# Patient Record
Sex: Female | Born: 1950
Health system: Southern US, Community
[De-identification: ages and names within clinical notes are randomized; demographics above are authoritative.]

## PROBLEM LIST (undated history)

## (undated) DIAGNOSIS — E119 Type 2 diabetes mellitus without complications: Secondary | ICD-10-CM

## (undated) DIAGNOSIS — I10 Essential (primary) hypertension: Secondary | ICD-10-CM

## (undated) HISTORY — PX: TONSILLECTOMY: SUR1361

## (undated) HISTORY — PX: ABDOMINAL HYSTERECTOMY: SHX81

## (undated) HISTORY — DX: Essential (primary) hypertension: I10

## (undated) HISTORY — DX: Type 2 diabetes mellitus without complications: E11.9

---

## 2000-01-11 ENCOUNTER — Encounter: Admission: RE | Admit: 2000-01-11 | Discharge: 2000-02-01 | Payer: Self-pay | Admitting: Family Medicine

## 2001-06-12 ENCOUNTER — Ambulatory Visit (HOSPITAL_COMMUNITY): Admission: RE | Admit: 2001-06-12 | Discharge: 2001-06-12 | Payer: Self-pay | Admitting: *Deleted

## 2003-03-05 ENCOUNTER — Encounter: Payer: Self-pay | Admitting: Family Medicine

## 2003-03-05 ENCOUNTER — Ambulatory Visit (HOSPITAL_COMMUNITY): Admission: RE | Admit: 2003-03-05 | Discharge: 2003-03-05 | Payer: Self-pay | Admitting: Family Medicine

## 2003-04-01 ENCOUNTER — Encounter: Admission: RE | Admit: 2003-04-01 | Discharge: 2003-04-01 | Payer: Self-pay | Admitting: Obstetrics & Gynecology

## 2003-04-01 ENCOUNTER — Encounter: Payer: Self-pay | Admitting: Obstetrics & Gynecology

## 2003-04-06 ENCOUNTER — Encounter: Payer: Self-pay | Admitting: Obstetrics & Gynecology

## 2003-04-06 ENCOUNTER — Ambulatory Visit (HOSPITAL_COMMUNITY): Admission: RE | Admit: 2003-04-06 | Discharge: 2003-04-06 | Payer: Self-pay | Admitting: Obstetrics & Gynecology

## 2003-04-21 ENCOUNTER — Inpatient Hospital Stay (HOSPITAL_COMMUNITY): Admission: AD | Admit: 2003-04-21 | Discharge: 2003-04-24 | Payer: Self-pay | Admitting: Obstetrics & Gynecology

## 2003-04-21 ENCOUNTER — Encounter (INDEPENDENT_AMBULATORY_CARE_PROVIDER_SITE_OTHER): Payer: Self-pay

## 2003-04-21 ENCOUNTER — Encounter (INDEPENDENT_AMBULATORY_CARE_PROVIDER_SITE_OTHER): Payer: Self-pay | Admitting: *Deleted

## 2003-04-28 ENCOUNTER — Inpatient Hospital Stay (HOSPITAL_COMMUNITY): Admission: AD | Admit: 2003-04-28 | Discharge: 2003-05-04 | Payer: Self-pay | Admitting: Obstetrics & Gynecology

## 2003-04-28 ENCOUNTER — Encounter: Payer: Self-pay | Admitting: Obstetrics & Gynecology

## 2003-04-29 ENCOUNTER — Encounter: Payer: Self-pay | Admitting: Obstetrics & Gynecology

## 2003-04-30 ENCOUNTER — Encounter: Payer: Self-pay | Admitting: Obstetrics & Gynecology

## 2003-05-20 ENCOUNTER — Encounter: Admission: RE | Admit: 2003-05-20 | Discharge: 2003-05-20 | Payer: Self-pay | Admitting: Obstetrics & Gynecology

## 2003-05-20 ENCOUNTER — Encounter: Payer: Self-pay | Admitting: Obstetrics & Gynecology

## 2004-04-17 ENCOUNTER — Encounter: Admission: RE | Admit: 2004-04-17 | Discharge: 2004-04-17 | Payer: Self-pay | Admitting: Obstetrics & Gynecology

## 2005-04-27 ENCOUNTER — Encounter: Admission: RE | Admit: 2005-04-27 | Discharge: 2005-04-27 | Payer: Self-pay | Admitting: Obstetrics & Gynecology

## 2005-05-02 ENCOUNTER — Ambulatory Visit (HOSPITAL_COMMUNITY): Admission: RE | Admit: 2005-05-02 | Discharge: 2005-05-02 | Payer: Self-pay | Admitting: Obstetrics & Gynecology

## 2005-06-19 ENCOUNTER — Ambulatory Visit (HOSPITAL_COMMUNITY): Admission: RE | Admit: 2005-06-19 | Discharge: 2005-06-19 | Payer: Self-pay | Admitting: Family Medicine

## 2006-05-01 ENCOUNTER — Encounter: Admission: RE | Admit: 2006-05-01 | Discharge: 2006-05-01 | Payer: Self-pay | Admitting: Obstetrics & Gynecology

## 2007-03-14 ENCOUNTER — Emergency Department (HOSPITAL_COMMUNITY): Admission: EM | Admit: 2007-03-14 | Discharge: 2007-03-14 | Payer: Self-pay | Admitting: Emergency Medicine

## 2007-05-06 ENCOUNTER — Encounter: Admission: RE | Admit: 2007-05-06 | Discharge: 2007-05-06 | Payer: Self-pay | Admitting: Family Medicine

## 2007-05-09 ENCOUNTER — Encounter: Admission: RE | Admit: 2007-05-09 | Discharge: 2007-05-09 | Payer: Self-pay | Admitting: Family Medicine

## 2008-05-19 ENCOUNTER — Encounter: Admission: RE | Admit: 2008-05-19 | Discharge: 2008-05-19 | Payer: Self-pay | Admitting: Family Medicine

## 2008-05-28 ENCOUNTER — Encounter: Admission: RE | Admit: 2008-05-28 | Discharge: 2008-05-28 | Payer: Self-pay | Admitting: Family Medicine

## 2008-07-18 IMAGING — [facility] MM DIAGNOSTIC LTD RIGHT
3 series · 3 of 3 positions shown · non-contrast
Comparison: none

[REDACTED] RIGHT
CC and MLO view(s) were taken of the right breast.
Technologist: Aon[place], Gouren(KOUDAI)

RIGHT BREAST ULTRASOUND
Technologist: Mark Alma Motoh, Medical
DIGITAL LIMITED RIGHT DIAGNOSTIC MAMMOGRAM AND RIGHT BREAST ULTRASOUND:
CLINICAL DATA: 56-year-old female with abnormal screening mammogram with possible right breast 
mass.

[R CC (1 of 2)]
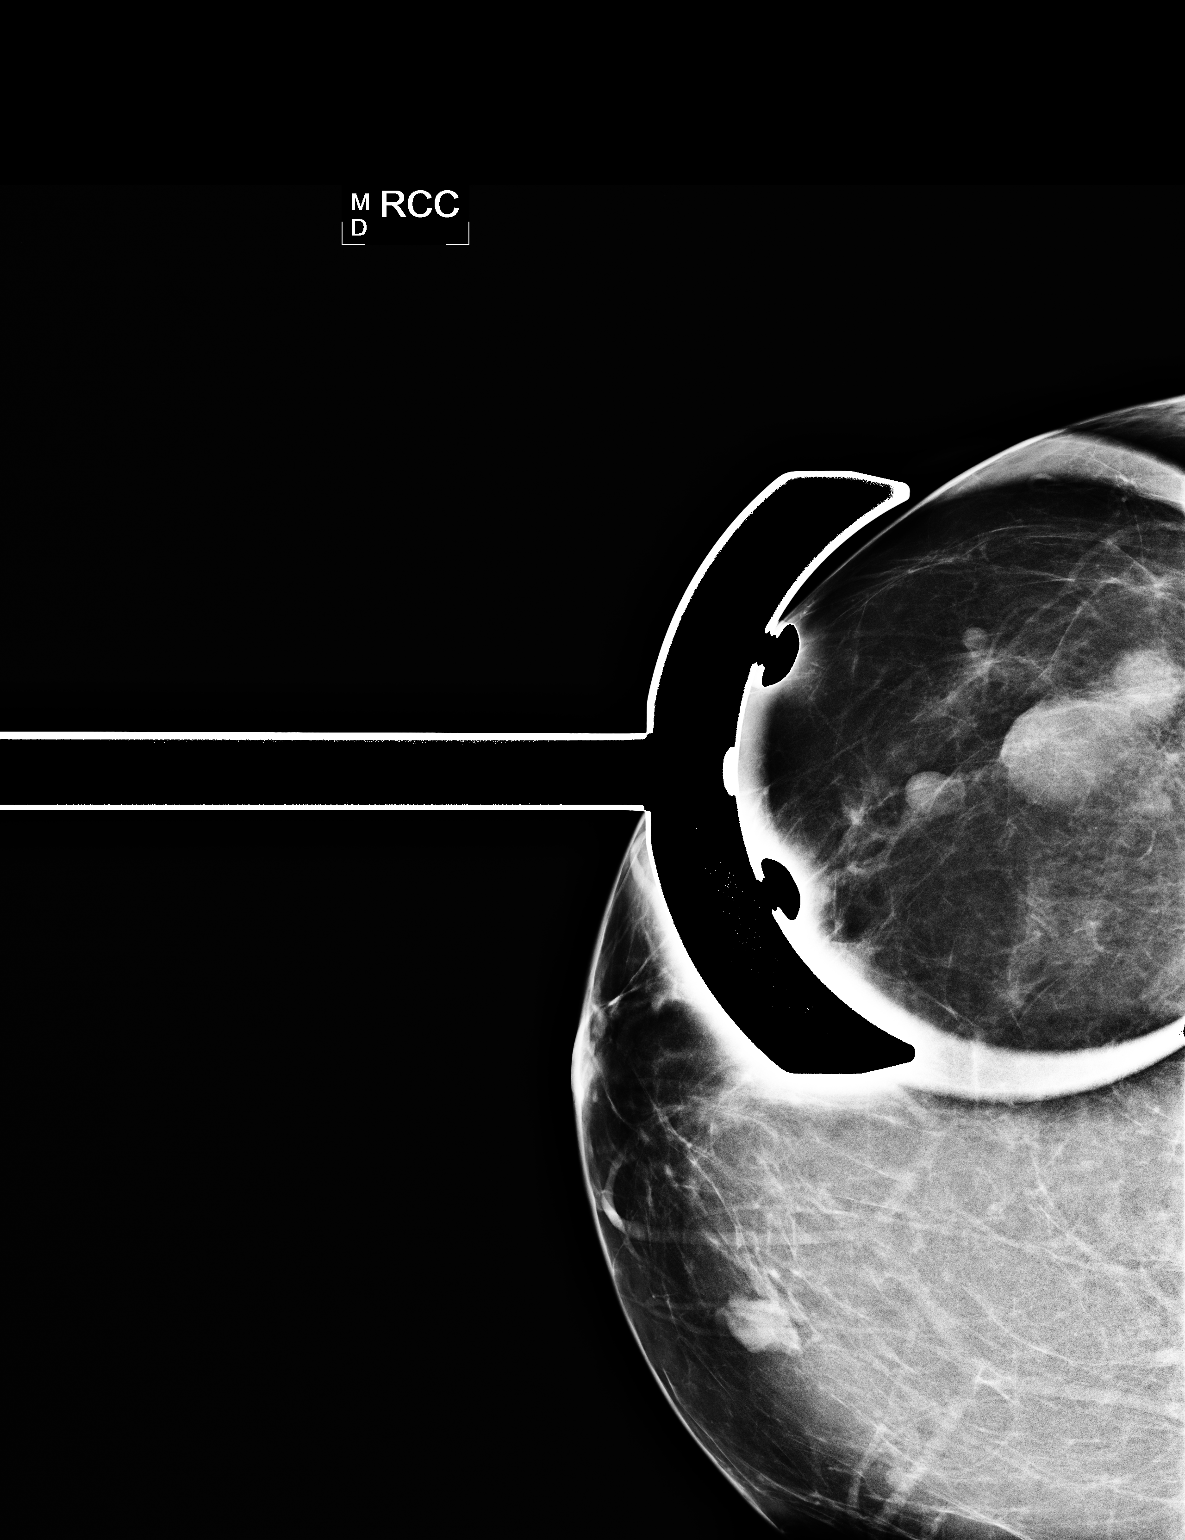

[R MLO]
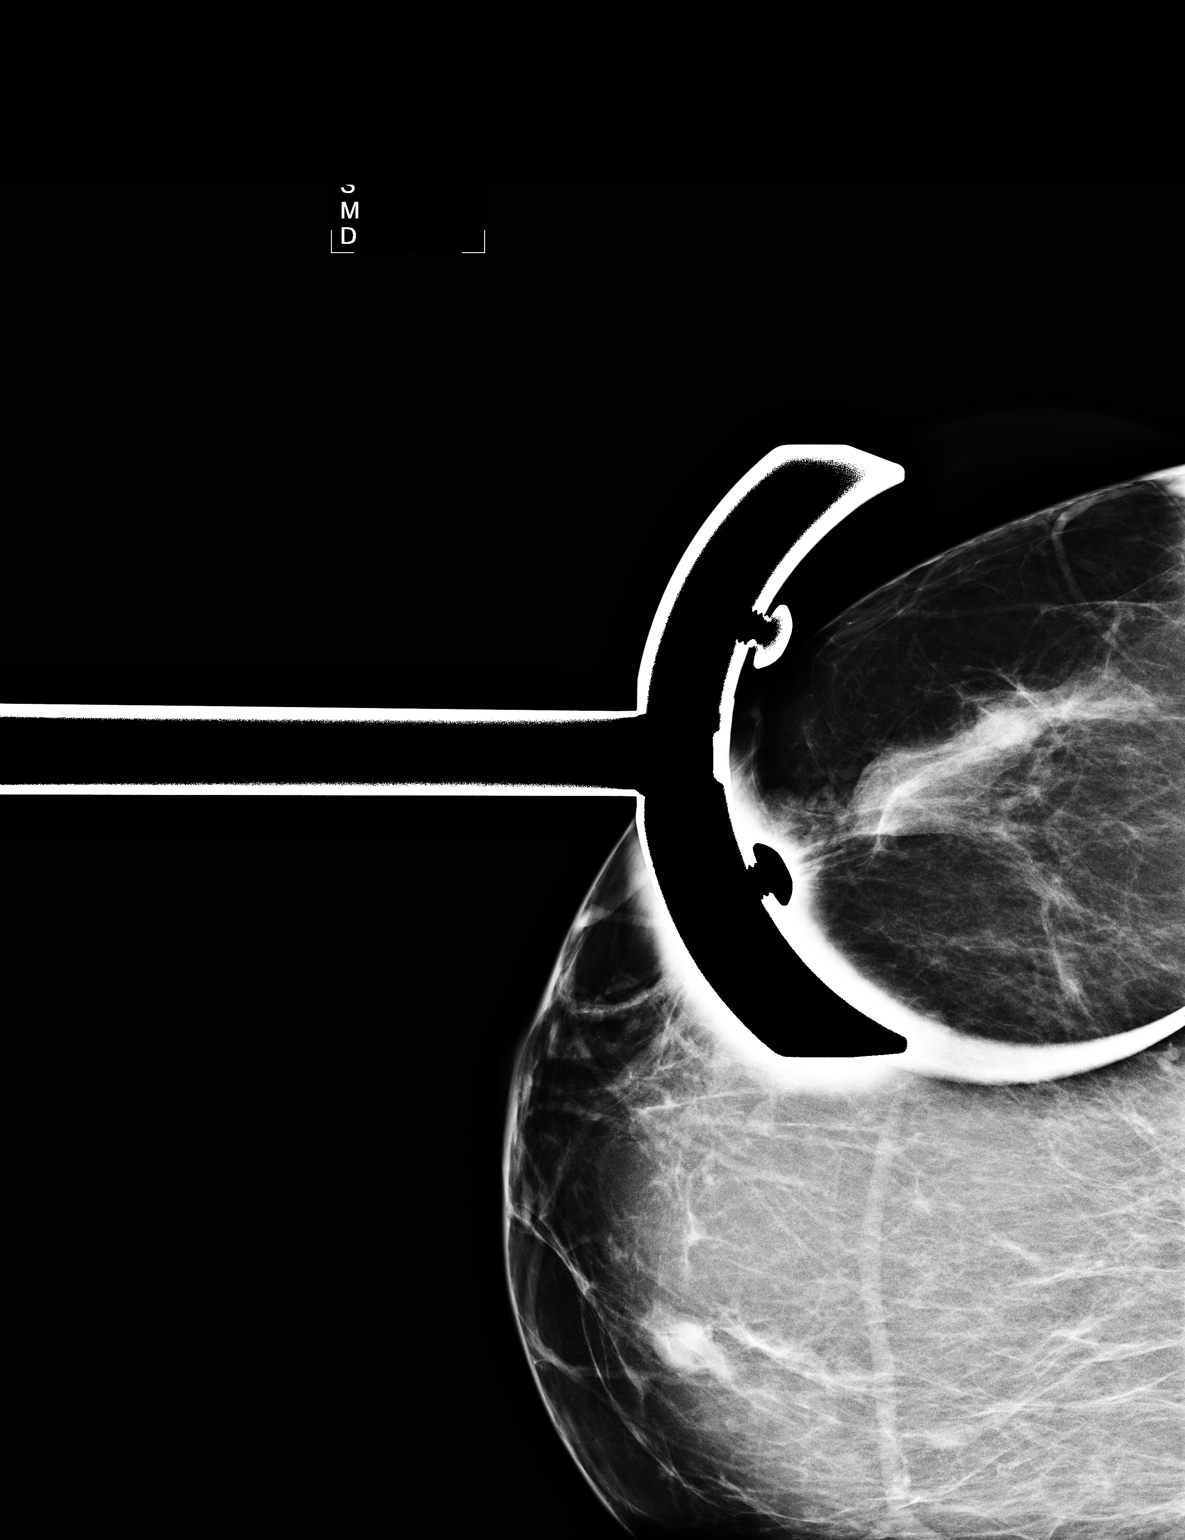

[R CC (2 of 2)]
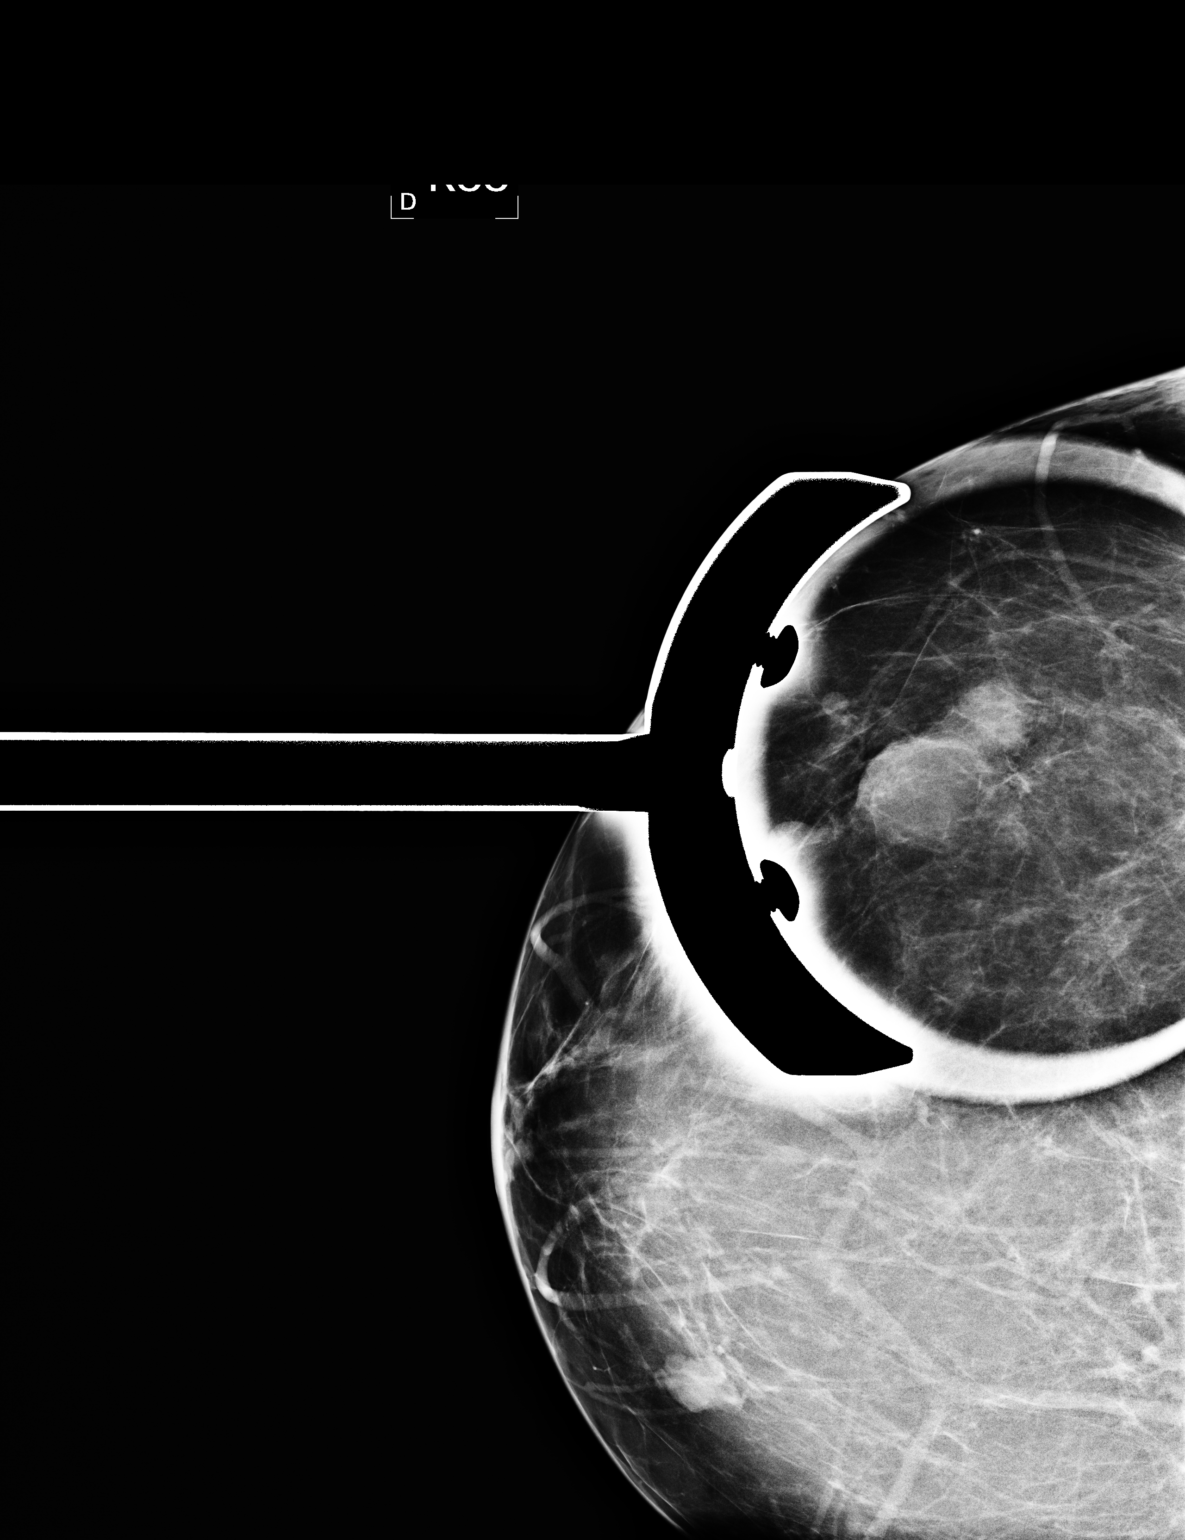

[3 of 3 positions shown; findings below may reference images not displayed]

Spot compression views of the upper outer right breast demonstrate well-circumscribed oval 
densities; at least four lie adjacent to each other in the upper outer right breast.  There is no 
evidence of malignant calcifications.

Physical examination of the upper outer right breast demonstrates nodularity in the right breast in
the 10 o'clock position 5 cm from the nipple.

Targeted ultrasound evaluation of the upper outer right breast demonstrates multiple simple cysts 
in the 10 o'clock position 5 cm from the nipple and all adjacent to each other.
IMPRESSION: Simple cysts within the upper outer right breast corresponding to mammographic abnormality.  No 
evidence of malignancy.

Recommend bilateral screening mammogram in one year.

ASSESSMENT: Benign - BI-RADS 2

Screening mammogram of both breasts in 1 year.
,

## 2009-05-20 ENCOUNTER — Encounter: Admission: RE | Admit: 2009-05-20 | Discharge: 2009-05-20 | Payer: Self-pay | Admitting: Family Medicine

## 2009-06-28 ENCOUNTER — Encounter: Admission: RE | Admit: 2009-06-28 | Discharge: 2009-06-28 | Payer: Self-pay | Admitting: Orthopedic Surgery

## 2009-07-19 ENCOUNTER — Encounter: Admission: RE | Admit: 2009-07-19 | Discharge: 2009-07-19 | Payer: Self-pay | Admitting: Orthopedic Surgery

## 2009-08-01 ENCOUNTER — Encounter (HOSPITAL_COMMUNITY): Admission: RE | Admit: 2009-08-01 | Discharge: 2009-08-24 | Payer: Self-pay | Admitting: Orthopedic Surgery

## 2009-08-07 IMAGING — MG MM DIAGNOSTIC LTD RIGHT
2 series · 2 of 2 positions shown · non-contrast
Comparison: [DATE] [DATE], [DATE], [DATE] [DATE], [DATE]

CLINICAL DATA: Possible mass right breast

DIGITAL DIAGNOSTIC  RIGHT  MAMMOGRAM  May 28, 2008 AND RIGHT
BREAST ULTRASOUND:

[R CC]
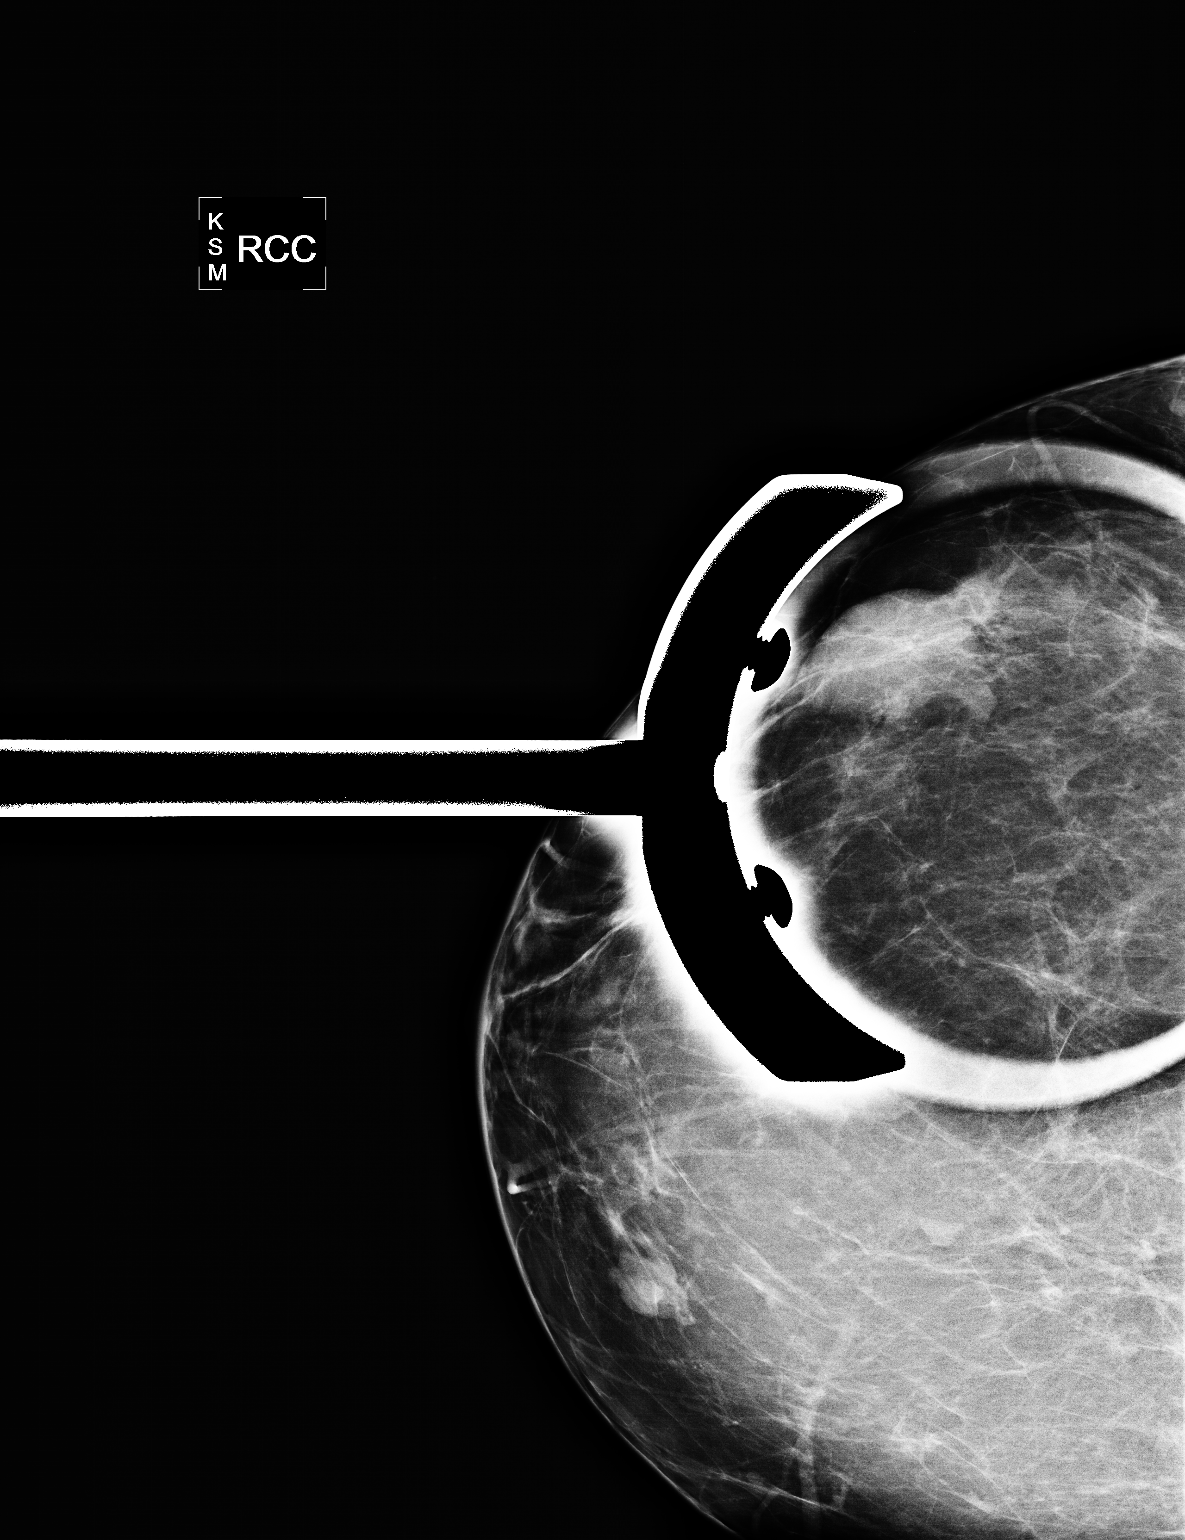

[R MLO]
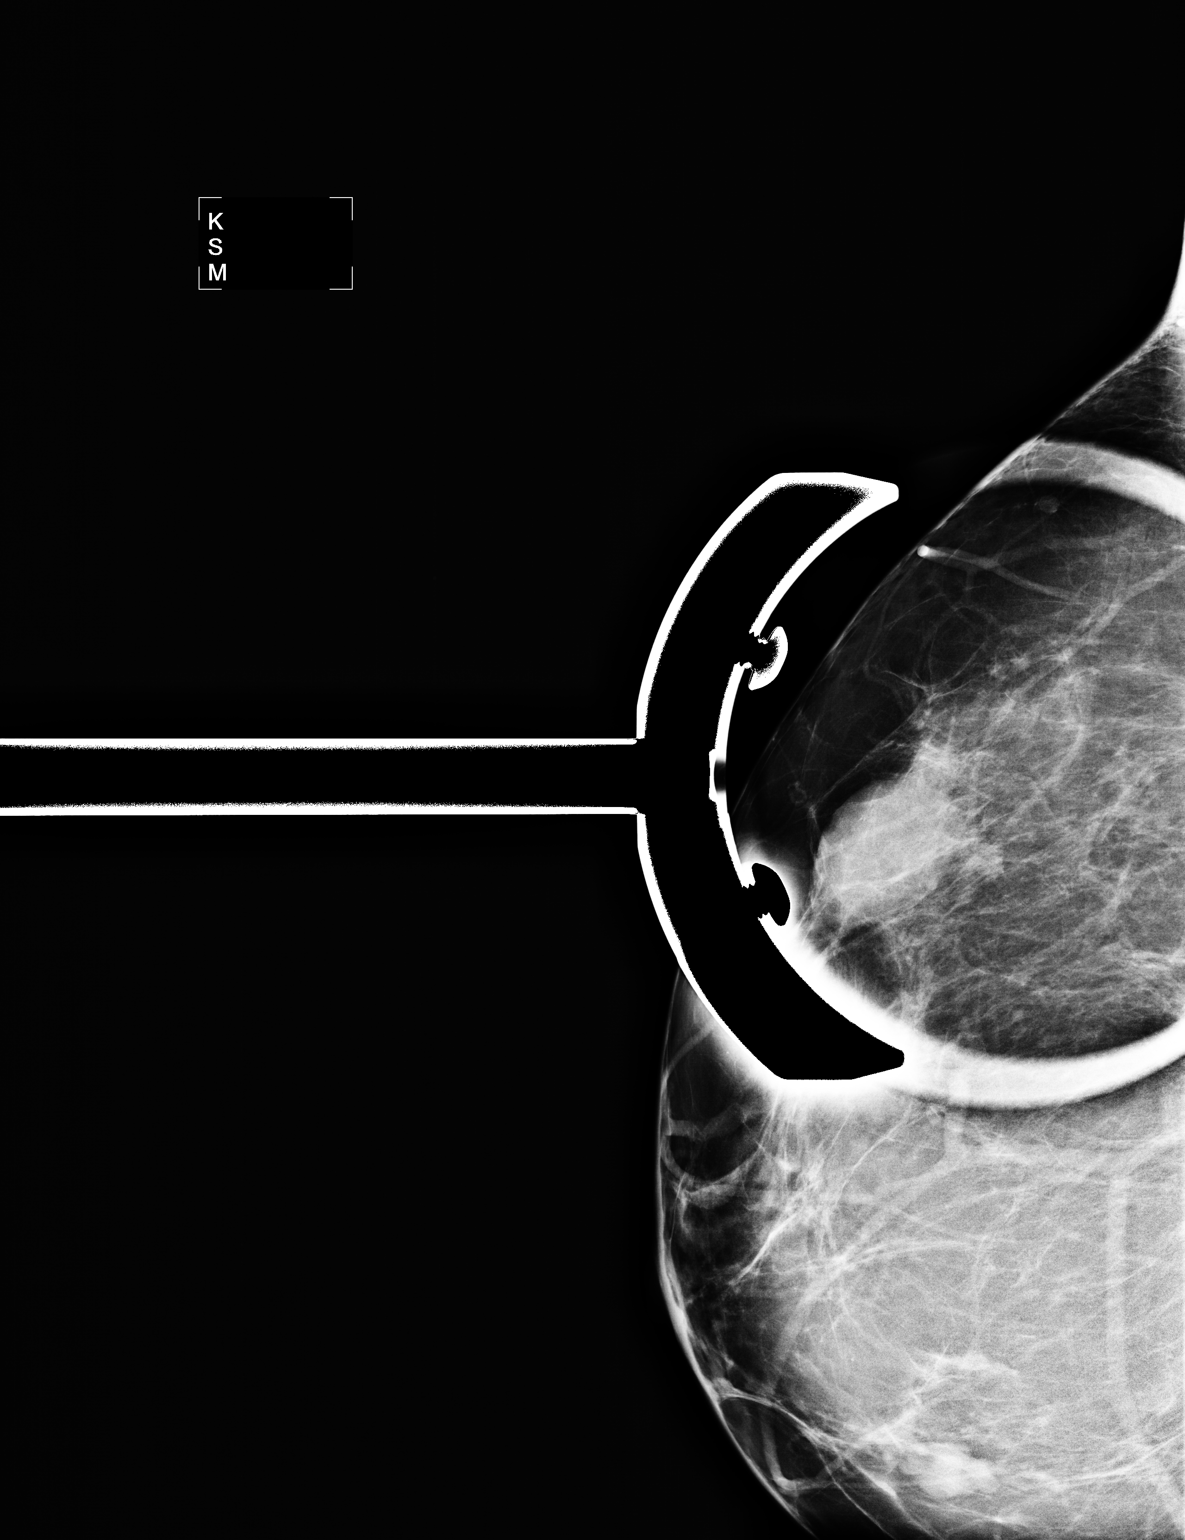

[2 of 2 positions shown; findings below may reference images not displayed]

FINDINGS: Spot compression CC and MLO views of the right breast are
submitted reviewed.  The previously noted  2.4 cm mass persists in
the upper outer quadrant right breast.  Two smaller adjacent
nodularity are present.

Ultrasound is performed, showing a 1.63 x 1.46 x 1.86 cm cyst with
mobile debri correlating to the mammographic finding.  There are
smaller adjacent cysts correlating to the mammographic finding.
IMPRESSION: Benign, recommend routine screening mammogram back on schedule

BI-RADS CATEGORY 2:  Benign finding(s).

## 2010-05-22 ENCOUNTER — Encounter: Admission: RE | Admit: 2010-05-22 | Discharge: 2010-05-22 | Payer: Self-pay | Admitting: Family Medicine

## 2010-09-17 ENCOUNTER — Encounter: Payer: Self-pay | Admitting: Orthopedic Surgery

## 2010-09-17 ENCOUNTER — Encounter: Payer: Self-pay | Admitting: Obstetrics & Gynecology

## 2011-01-12 NOTE — H&P (Signed)
   NAME:  Shari Ortiz, Shari Ortiz                           ACCOUNT NO.:  [id number]   MEDICAL RECORD NO.:  [id number]                   PATIENT TYPE:  INP   LOCATION:  9326                                 [facility]ACILITY:  [facility]   PHYSICIAN:  Roseanna Rainbow, M.D.         DATE OF BIRTH:  Apr 14, 1951   DATE OF ADMISSION:  04/28/2003  DATE OF DISCHARGE:                                HISTORY & PHYSICAL   CHIEF COMPLAINT:  The patient is a 60 year old African-American female, 1  week status post total abdominal hysterectomy and bilateral salpingo-  oophorectomy, now with nausea and vomiting times 1 day.   HISTORY OF PRESENT ILLNESS:  As above. The patient reports normal bowel  movement yesterday. She denies any fevers, abdominal pain.   PAST OBSTETRIC/GYNECOLOGIC HISTORY:  As above.   PAST MEDICAL HISTORY:  Broken left wrist.   PAST SURGICAL HISTORY:  As above.   SOCIAL HISTORY:  Divorced.  She denies any tobacco use, alcohol, or street  drug use.   FAMILY HISTORY:  Noncontributory.   REVIEW OF SYSTEMS:  GI: See history of present illness.   ALLERGIES:  No known drug allergies.   MEDICATIONS:  __________ and Jesusita Oka.   PHYSICAL EXAMINATION:  GENERAL: No acute distress.  VITAL SIGNS: Weight 176 pounds. Temperature 98.3, pulse 76, blood pressure  138/84.  ABDOMEN: Softly distended. Normal active bowel sounds throughout. Nontender.  LUNGS: Clear to auscultation bilaterally.  HEART: Regular rate and rhythm.  EXTREMITIES: Nontender.   LABORATORY DATA:  Urinalysis, specific gravity 1015, pH 6, trace leukocytes,  trace protein, negative ketones.   ASSESSMENT:  One week status post total abdominal hysterectomy, bilateral  salpingo-oophorectomy, rule out ileus versus early mechanical small bowel  obstruction.   PLAN:  Admission. IV hydration, anti-emetics. Abdominal x-ray and laboratory  work.                                              Roseanna Rainbow, M.D.   [name]  D:   04/28/2003  T:  04/28/2003  Job:  [id number]

## 2011-01-12 NOTE — Discharge Summary (Signed)
   NAME:  Shari Ortiz, Shari Ortiz                           ACCOUNT NO.:  [id number]   MEDICAL RECORD NO.:  [id number]                   PATIENT TYPE:  INP   LOCATION:  9106                                 [facility]   PHYSICIAN:  Roseanna Rainbow, M.D.         DATE OF BIRTH:  [birth year]   DATE OF ADMISSION:  04/21/2003  DATE OF DISCHARGE:  04/24/2003                                 DISCHARGE SUMMARY   CHIEF COMPLAINT:  The patient is a 60 year old African-American female with  a left-sided adnexal mass and uterine fibroids who presents for low  abdominal hysterectomy and left salping oophorectomy.  Please see the  dictated history and physical for further details.   HOSPITAL COURSE:  The patient was admitted and underwent a total abdominal  hysterectomy with bilateral salpingo-oophorectomy and lysis of adhesions.  Again see the dictated operative summary for further details.  Her  postoperative course was remarkable for hypokalemia which was repleted.  She  was discharged to home on postoperative day #3 tolerating a regular diet.   DISCHARGE DIAGNOSES:  1. Uterine fibroids.  2. Left-sided endometriotic cyst.   PROCEDURE:  Total abdominal hysterectomy and bilateral salpingo-oophorectomy  and lysis of adhesions.   DISCHARGE CONDITION:  Good.   DISCHARGE DIET:  Regular.   DISCHARGE ACTIVITIES:  No strenuous activity.   DISCHARGE MEDICATIONS:  1. Percocet 1-2 tablets q.i.d.  2. Resume home medications.   DISPOSITION:  The patient was to follow up in the office in one week.                                                Roseanna Rainbow, M.D.    [name]  D:  05/19/2003  T:  05/19/2003  Job:  [id number]

## 2011-01-12 NOTE — Discharge Summary (Signed)
   NAME:  Shari Ortiz, KOCAK                           ACCOUNT NO.:  [id number]   MEDICAL RECORD NO.:  [id number]                   PATIENT TYPE:  INP   LOCATION:  9148                                 [facility]   PHYSICIAN:  Roseanna Rainbow, M.D.         DATE OF BIRTH:  1951-07-28   DATE OF ADMISSION:  04/28/2003  DATE OF DISCHARGE:  05/04/2003                                 DISCHARGE SUMMARY   CHIEF COMPLAINT:  The patient is a 60 year old African-American female one  week status post total abdominal hysterectomy and bilateral salpingo-  oophorectomy now with nausea and vomiting.   HISTORY OF PRESENT ILLNESS:  Please see the dictated history and physical  for further details.  The patient was admitted and abdominal x-ray  demonstrated an early partial small bowel obstruction versus an ileus.  NG  tube suction drainage was initiated along with bowel rest and IV hydration.  On hospital day #2 the patient had a bowel movement.  However, she had a  fever of 101.4 without leukocytosis.  A CT of the abdomen and pelvis was  then obtained as well as a general surgery consult.  At this point she was  started on clindamycin parenterally as well as gentamicin.  CT scan  demonstrated no obstruction, probable ileus.  At this point she was  afebrile.  The NG tube was clamped.  Her diet was slowly advanced and the NG  tube was removed.  She was discharged to home on hospital day #7 tolerating  a regular diet.   DISCHARGE DIAGNOSES:  Postoperative ileus.   CONDITION ON DISCHARGE:  Stable.   DIET:  Regular.   ACTIVITY:  No strenuous activity.   DISCHARGE MEDICATIONS:  Protonix one tablet p.o. daily.   DISPOSITION:  The patient was to follow up in the office in one week.                                               Roseanna Rainbow, M.D.    [name]  D:  07/01/2003  T:  07/02/2003  Job:  [id number]

## 2011-01-12 NOTE — H&P (Signed)
NAME:  Shari Ortiz, Shari Ortiz                           ACCOUNT NO.:  [id number]   MEDICAL RECORD NO.:  [id number]                   PATIENT TYPE:  INP   LOCATION:  NA                                   [facility]   PHYSICIAN:  Roseanna Rainbow, M.D.         DATE OF BIRTH:  1950/10/23   DATE OF ADMISSION:  DATE OF DISCHARGE:                                HISTORY & PHYSICAL   CHIEF COMPLAINT:  The patient is a 60 year old African-American female with  a left-sided adnexal mass and uterine fibroids who presents for a total  abdominal hysterectomy and left salpingo-oophorectomy.   HISTORY OF PRESENT ILLNESS:  The patient has a several-month history of left  lower quadrant pain.  The quality of the pain is poorly described.  Workup  includes a CA125 on March 18, 2003 that was 15.3; a CT scan on July 23 that  was a negative CT scan of the abdomen and a complex cystic and solid mass in  the left lower pelvis with no evidence of lymphadenopathy; serial  ultrasounds consistent with a 6-8 cm left-sided hemorrhagic lesion on the  ovary.   PAST OBSTETRICAL AND GYNECOLOGICAL HISTORY:  Last menstrual period  approximately two years ago.  Onset of menopausal symptoms began at age 81.  Symptoms included hot flashes.  Hormone replacement therapy has never been  used.  She is status post a right salpingo-oophorectomy and postpartum  bilateral tubal ligation.  She has been pregnancy one time.   PAST MEDICAL HISTORY:  Broken left wrist.   SOCIAL HISTORY:  She is divorced.   FAMILY HISTORY:  Noncontributory.   REVIEW OF SYSTEMS:  GI AND GU:  See the history of present illness.   ALLERGIES:  No known drug allergies.   MEDICATIONS:  None.   PHYSICAL EXAMINATION:  VITAL SIGNS:  Weight 177 pounds, temperature 98.1,  pulse 64, blood pressure 126/70.  GENERAL:  African-American female, appears stated age.  Normal body habitus.  NECK:  Supple.  LUNGS:  Clear to auscultation bilaterally.  HEART:   Regular rate and rhythm.  ABDOMEN:  Tenderness noted in the left lower quadrant.  PELVIC:  The external female genitalia are normal appearing.  On bimanual  exam the uterus is upper limits of normal, anterior.  The adnexa were not  felt.   ASSESSMENT:  Left-sided adnexal mass, likely benign.  Also, small myomatous  uterus.    PLAN:  The planned procedure is total abdominal hysterectomy and left  salpingo-oophorectomy.  The risks, benefits, and alternative forms of  management were reviewed with the patient and informed consent was obtained.                                               Roseanna Rainbow, M.D.    Shari Ortiz  D:  04/13/2003  T:  04/13/2003  Job:  [id number]

## 2011-01-12 NOTE — Op Note (Signed)
NAME:  Shari Ortiz, Shari Ortiz                           ACCOUNT NO.:  [id number]   MEDICAL RECORD NO.:  [id number]                   PATIENT TYPE:  INP   LOCATION:  9106                                 [facility]   PHYSICIAN:  Roseanna Rainbow, M.D.         DATE OF BIRTH:  March 24, 1951   DATE OF PROCEDURE:  04/21/2003  DATE OF DISCHARGE:                                 OPERATIVE REPORT   PREOPERATIVE DIAGNOSES:  1. Left-sided hemorrhagic ovarian cyst.  2. Uterine fibroids.  3. Pelvic pain.   POSTOPERATIVE DIAGNOSES:  1. Left-sided hemorrhagic ovarian cyst.  2. Uterine fibroids.  3. Pelvic pain.   PROCEDURE:  Total abdominal hysterectomy, bilateral salpingo-oophorectomy,  and lysis of adhesions.   SURGEONS:  Roseanna Rainbow, M.D., and Bing Neighbors. Clearance Coots, M.D.   ANESTHESIA:  General endotracheal.   COMPLICATIONS:  None.   ESTIMATED BLOOD LOSS:  200 mL.   FLUIDS AND URINE OUTPUT:  As per anesthesiology.   OPERATIVE FINDINGS:  A slightly enlarged uterus.  The patient had reported  having had a right salpingo-oophorectomy; however, the right adnexa was  present and there was ovarian tissue noted.  On the left side, the left  ovary appeared grossly normal; however, sitting in the posterior cul-de-sac  on the left side was a cystic mass with filmy adhesions to the broad  ligament.  This was several centimeters in diameter with a smooth wall  exteriorly.  Frozen section revealed benign tissue.   DESCRIPTION OF PROCEDURE:  The risks, benefits, indications, and  alternatives of the procedure were reviewed with the patient and informed  consent was obtained.  The patient was taken to the operating room with an  IV running.  The patient was then placed in the dorsal lithotomy position  and given general anesthesia, prepped and draped in the usual sterile  fashion.  A midline incision was then made through the previous scar and  extended to the rectus fascia.  The fascia was  incised along the length of  the incision.  The muscles of the anterior abdominal wall were separated in  the midline.  The peritoneum was grasped between two pickups, elevated, and  entered sharply with the scalpel.  The pelvis was examined with the findings  noted above.  An O'Connor-O'Sullivan retractor was placed into the incision  and the bowel packed away with moistened laparotomy sponges.  Two long Kelly  clamps were placed on the cornua and used for retraction.  The round  ligaments on both sides were transected with the Bovie.  The anterior leaf  of the broad ligament was incised along the bladder reflection to the  midline from both sides.  There was some redundancy from previous surgery in  the bladder reflection, and these adhesions were lysed sharply with the  Bovie.  The bladder was dissected off the lower uterine segment and cervix.  The infundibulopelvic ligaments on both sides were  doubly clamped,  transected, and suture ligated with 0 Vicryl.  Free ligatures were placed as  well.  Hemostasis was visualized.  The uterine arteries were skeletonized  bilaterally, clamped with parametrial clamps, transected, and suture ligated  with 0 Vicryl.  Again hemostasis was assured.  The uterosacral ligaments on  both sides were transected and suture ligated in a similar fashion.  The  cervix and uterus were amputated.  The vaginal cuff angles were closed with  figure-of-eight sutures of 0 Vicryl.  The remainder of the vaginal cuff was  closed with a series of interrupted 0 Vicryl figure-of-eight sutures.  Hemostasis was assured.  Please note that prior to the completion of the  hysterectomy, the previous-noted cul-de-sac lesion was removed using finger  fracture blunt dissection.  The pelvis was irrigated copiously with warm  normal saline.  All laparotomy sponges and instruments were removed from the  abdomen.  The fascia and parietal peritoneum and muscle were closed in a  bulk  fashion using 0 PDS.  The Scarpa's fascia was reapproximated with 2-0  Monocryl.  The skin was closed with staples.  The sponge, lap, needle, and  instrument counts were correct x2.  The patient was taken to the PACU awake  and in stable condition.                                               Roseanna Rainbow, M.D.    [name]  D:  04/21/2003  T:  04/21/2003  Job:  [id number]

## 2011-05-28 ENCOUNTER — Other Ambulatory Visit: Payer: Self-pay | Admitting: Family Medicine

## 2011-05-28 DIAGNOSIS — Z1231 Encounter for screening mammogram for malignant neoplasm of breast: Secondary | ICD-10-CM

## 2011-06-01 ENCOUNTER — Ambulatory Visit
Admission: RE | Admit: 2011-06-01 | Discharge: 2011-06-01 | Disposition: A | Discharge status: Home / Self Care | Payer: Commercial Indemnity | Source: Ambulatory Visit | Attending: Family Medicine | Admitting: Family Medicine

## 2011-06-01 DIAGNOSIS — Z1231 Encounter for screening mammogram for malignant neoplasm of breast: Secondary | ICD-10-CM

## 2012-05-07 ENCOUNTER — Other Ambulatory Visit: Payer: Self-pay | Admitting: Family Medicine

## 2012-05-07 DIAGNOSIS — Z1231 Encounter for screening mammogram for malignant neoplasm of breast: Secondary | ICD-10-CM

## 2012-06-05 ENCOUNTER — Ambulatory Visit
Admission: RE | Admit: 2012-06-05 | Discharge: 2012-06-05 | Disposition: A | Discharge status: Home / Self Care | Payer: Commercial Indemnity | Source: Ambulatory Visit | Attending: Family Medicine | Admitting: Family Medicine

## 2012-06-05 DIAGNOSIS — Z1231 Encounter for screening mammogram for malignant neoplasm of breast: Secondary | ICD-10-CM

## 2013-04-24 ENCOUNTER — Other Ambulatory Visit: Payer: Self-pay | Admitting: Family Medicine

## 2013-04-24 DIAGNOSIS — K219 Gastro-esophageal reflux disease without esophagitis: Secondary | ICD-10-CM

## 2013-04-30 ENCOUNTER — Ambulatory Visit
Admission: RE | Admit: 2013-04-30 | Discharge: 2013-04-30 | Disposition: A | Discharge status: Home / Self Care | Payer: Managed Care, Other (non HMO) | Source: Ambulatory Visit | Attending: Family Medicine | Admitting: Family Medicine

## 2013-04-30 DIAGNOSIS — K219 Gastro-esophageal reflux disease without esophagitis: Secondary | ICD-10-CM

## 2013-05-11 ENCOUNTER — Other Ambulatory Visit: Payer: Self-pay

## 2013-05-11 DIAGNOSIS — Z1231 Encounter for screening mammogram for malignant neoplasm of breast: Secondary | ICD-10-CM

## 2013-06-10 ENCOUNTER — Ambulatory Visit
Admission: RE | Admit: 2013-06-10 | Discharge: 2013-06-10 | Disposition: A | Discharge status: Home / Self Care | Payer: Managed Care, Other (non HMO) | Source: Ambulatory Visit

## 2013-06-10 DIAGNOSIS — Z1231 Encounter for screening mammogram for malignant neoplasm of breast: Secondary | ICD-10-CM

## 2013-11-02 ENCOUNTER — Ambulatory Visit (HOSPITAL_COMMUNITY)
Admission: RE | Admit: 2013-11-02 | Discharge: 2013-11-02 | Disposition: A | Discharge status: Home / Self Care | Payer: Managed Care, Other (non HMO) | Source: Ambulatory Visit | Attending: Family Medicine | Admitting: Family Medicine

## 2013-11-02 ENCOUNTER — Other Ambulatory Visit (HOSPITAL_COMMUNITY): Payer: Self-pay | Admitting: Family Medicine

## 2013-11-02 DIAGNOSIS — M1250 Traumatic arthropathy, unspecified site: Secondary | ICD-10-CM

## 2013-11-02 DIAGNOSIS — M25559 Pain in unspecified hip: Secondary | ICD-10-CM | POA: Insufficient documentation

## 2014-01-15 ENCOUNTER — Other Ambulatory Visit: Payer: Self-pay | Admitting: Orthopedic Surgery

## 2014-01-15 DIAGNOSIS — M545 Low back pain, unspecified: Secondary | ICD-10-CM

## 2014-01-16 ENCOUNTER — Ambulatory Visit
Admission: RE | Admit: 2014-01-16 | Discharge: 2014-01-16 | Disposition: A | Discharge status: Home / Self Care | Payer: Managed Care, Other (non HMO) | Source: Ambulatory Visit | Attending: Orthopedic Surgery | Admitting: Orthopedic Surgery

## 2014-01-16 DIAGNOSIS — M545 Low back pain, unspecified: Secondary | ICD-10-CM

## 2014-05-04 ENCOUNTER — Other Ambulatory Visit: Payer: Self-pay

## 2014-05-04 DIAGNOSIS — Z1231 Encounter for screening mammogram for malignant neoplasm of breast: Secondary | ICD-10-CM

## 2014-06-11 ENCOUNTER — Encounter (INDEPENDENT_AMBULATORY_CARE_PROVIDER_SITE_OTHER): Payer: Self-pay

## 2014-06-11 ENCOUNTER — Ambulatory Visit
Admission: RE | Admit: 2014-06-11 | Discharge: 2014-06-11 | Disposition: A | Discharge status: Home / Self Care | Payer: Managed Care, Other (non HMO) | Source: Ambulatory Visit

## 2014-06-11 DIAGNOSIS — Z1231 Encounter for screening mammogram for malignant neoplasm of breast: Secondary | ICD-10-CM

## 2015-05-12 ENCOUNTER — Other Ambulatory Visit: Payer: Self-pay

## 2015-05-12 DIAGNOSIS — Z1231 Encounter for screening mammogram for malignant neoplasm of breast: Secondary | ICD-10-CM

## 2015-06-17 ENCOUNTER — Ambulatory Visit
Admission: RE | Admit: 2015-06-17 | Discharge: 2015-06-17 | Disposition: A | Discharge status: Home / Self Care | Payer: Managed Care, Other (non HMO) | Source: Ambulatory Visit

## 2015-06-17 DIAGNOSIS — Z1231 Encounter for screening mammogram for malignant neoplasm of breast: Secondary | ICD-10-CM

## 2015-07-14 ENCOUNTER — Other Ambulatory Visit (HOSPITAL_COMMUNITY)
Admission: RE | Admit: 2015-07-14 | Discharge: 2015-07-14 | Disposition: A | Discharge status: Home / Self Care | Payer: Managed Care, Other (non HMO) | Source: Ambulatory Visit | Attending: Family Medicine | Admitting: Family Medicine

## 2015-07-14 ENCOUNTER — Other Ambulatory Visit: Payer: Self-pay | Admitting: Family Medicine

## 2015-07-14 DIAGNOSIS — Z1151 Encounter for screening for human papillomavirus (HPV): Secondary | ICD-10-CM | POA: Insufficient documentation

## 2015-07-14 DIAGNOSIS — Z01419 Encounter for gynecological examination (general) (routine) without abnormal findings: Secondary | ICD-10-CM | POA: Insufficient documentation

## 2015-07-14 DIAGNOSIS — R87810 Cervical high risk human papillomavirus (HPV) DNA test positive: Secondary | ICD-10-CM | POA: Insufficient documentation

## 2015-07-18 LAB — CYTOLOGY - PAP

## 2015-11-14 ENCOUNTER — Other Ambulatory Visit (HOSPITAL_COMMUNITY)
Admission: RE | Admit: 2015-11-14 | Discharge: 2015-11-14 | Disposition: A | Discharge status: Home / Self Care | Payer: Managed Care, Other (non HMO) | Source: Ambulatory Visit | Attending: Family Medicine | Admitting: Family Medicine

## 2015-11-14 ENCOUNTER — Other Ambulatory Visit: Payer: Self-pay | Admitting: Family Medicine

## 2015-11-14 DIAGNOSIS — N760 Acute vaginitis: Secondary | ICD-10-CM | POA: Insufficient documentation

## 2015-11-14 DIAGNOSIS — Z113 Encounter for screening for infections with a predominantly sexual mode of transmission: Secondary | ICD-10-CM | POA: Insufficient documentation

## 2015-11-18 LAB — URINE CYTOLOGY ANCILLARY ONLY
Bacterial vaginitis: NEGATIVE
Candida vaginitis: NEGATIVE
Chlamydia: NEGATIVE
Neisseria Gonorrhea: NEGATIVE
Trichomonas: NEGATIVE

## 2016-03-02 ENCOUNTER — Ambulatory Visit
Admission: RE | Admit: 2016-03-02 | Discharge: 2016-03-02 | Disposition: A | Discharge status: Home / Self Care | Payer: Managed Care, Other (non HMO) | Source: Ambulatory Visit | Attending: Family Medicine | Admitting: Family Medicine

## 2016-03-02 ENCOUNTER — Other Ambulatory Visit: Payer: Self-pay | Admitting: Family Medicine

## 2016-03-02 DIAGNOSIS — R109 Unspecified abdominal pain: Secondary | ICD-10-CM

## 2016-03-02 MED ORDER — IOPAMIDOL (ISOVUE-300) INJECTION 61%
100.0000 mL | Freq: Once | INTRAVENOUS | Status: AC | PRN
  Administered 2016-03-02: 100 mL via INTRAVENOUS

## 2016-03-03 ENCOUNTER — Other Ambulatory Visit: Payer: Self-pay | Admitting: Family Medicine

## 2016-05-15 ENCOUNTER — Other Ambulatory Visit: Payer: Self-pay | Admitting: Family Medicine

## 2016-05-15 DIAGNOSIS — Z1231 Encounter for screening mammogram for malignant neoplasm of breast: Secondary | ICD-10-CM

## 2016-06-18 ENCOUNTER — Ambulatory Visit
Admission: RE | Admit: 2016-06-18 | Discharge: 2016-06-18 | Disposition: A | Discharge status: Home / Self Care | Payer: Managed Care, Other (non HMO) | Source: Ambulatory Visit | Attending: Family Medicine | Admitting: Family Medicine

## 2016-06-18 DIAGNOSIS — Z1231 Encounter for screening mammogram for malignant neoplasm of breast: Secondary | ICD-10-CM

## 2017-02-07 ENCOUNTER — Encounter (INDEPENDENT_AMBULATORY_CARE_PROVIDER_SITE_OTHER): Payer: Self-pay | Admitting: Orthopaedic Surgery

## 2017-02-07 ENCOUNTER — Ambulatory Visit (INDEPENDENT_AMBULATORY_CARE_PROVIDER_SITE_OTHER): Payer: Self-pay

## 2017-02-07 ENCOUNTER — Ambulatory Visit (INDEPENDENT_AMBULATORY_CARE_PROVIDER_SITE_OTHER): Payer: Worker's Compensation | Admitting: Orthopaedic Surgery

## 2017-02-07 VITALS — BP 119/64 | HR 81 | Ht 64.0 in | Wt 148.0 lb

## 2017-02-07 DIAGNOSIS — M25532 Pain in left wrist: Secondary | ICD-10-CM

## 2017-02-07 DIAGNOSIS — M654 Radial styloid tenosynovitis [de Quervain]: Secondary | ICD-10-CM

## 2017-02-07 NOTE — Progress Notes (Signed)
Office Visit Note   Patient: Shari Ortiz           Date of Birth: [birth year]           MRN: [id number] Visit Date: 02/07/2017              Requested by: Lucianne Lei, [address] [place], [place] [address] PCP: Lucianne Lei, MD   Assessment & Plan: Visit Diagnoses:  1. Pain in left wrist   2. De Quervain's tenosynovitis, left     Plan: Patient started on anti-inflammatories and used a splint. Injection recommended and performed with good relief. She gets some relief with the injection but not complete relief. We'll recheck her again in 2 weeks.  Follow-Up Instructions: Return in about 2 weeks (around 02/21/2017).   Orders:  Orders Placed This Encounter  Procedures  . Hand/Upper Extremity Injection/Arthrocentesis  . XR Wrist Complete Left   No orders of the defined types were placed in this encounter.     Procedures: Hand/UE Inj Date/Time: 02/10/2017 8:56 PM Performed by: Marybelle Killings Authorized by: Marybelle Killings   Site marked: the procedure site was marked   Condition: de Quervain's   Needle Size:  25 G Approach:  Radial Ultrasound Guidance: No   Medications:  0.33 mL bupivacaine 0.25 %; 0.3 mL lidocaine 1 %; 13.33 mg methylPREDNISolone acetate 40 MG/ML      Clinical Data: No additional findings.   Subjective: Chief Complaint  Patient presents with  . Left Wrist - Pain    HPI 66 year old female whose worked at [employer] for 45 years was reaching for a cone  of yarn and await been a backwards on her dominant wrist with an extension injury to wrist on 12/27/2016. She's been in a Velcro splint had a prednisone pack use intermittent ice anti-inflammatories without relief. She still been working using a wrist splint has pain at the base of her thumb pain with gripping and squeezing.  Review of Systems 14 point review of systems a obtained positive for hypertension previous hysterectomy tonsillectomy. Also positive for diabetes. Patient never smoked.  Otherwise negative as it pertains to history of present illness.   Objective: Vital Signs: BP 119/64   Pulse 81   Ht 5\' 4"  (1.626 m)   Wt 148 lb (67.1 kg)   BMI 25.40 kg/m   Physical Exam  Constitutional: She is oriented to person, place, and time. She appears well-developed.  HENT:  Head: Normocephalic.  Right Ear: External ear normal.  Left Ear: External ear normal.  Eyes: Pupils are equal, round, and reactive to light.  Neck: No tracheal deviation present. No thyromegaly present.  Cardiovascular: Normal rate.   Pulmonary/Chest: Effort normal.  Abdominal: Soft.  Musculoskeletal:  Normal cervical range of motion notes peripheral nerve entrapment upper extremities median nerve forearm and wrist as well as ulnar nerve. No shoulder subluxation elbow has full range of motion. Exquisite tenderness over the left wrist first  dorsal compartment. Pulses of the wrist are normal normal heel toe gait no hyperreflexia. Positive Finkelstein test. Negative Tinel's at the wrist negative Phalen's. Allen's test at the wrist is normal.  Neurological: She is alert and oriented to person, place, and time.  Skin: Skin is warm and dry.  Psychiatric: She has a normal mood and affect. Her behavior is normal.    Ortho Exam  Specialty Comments:  No specialty comments available.  Imaging: No results found.   PMFS History: Patient Active Problem List  Diagnosis Date Noted  . De Quervain's tenosynovitis, right 02/07/2017   Past Medical History:  Diagnosis Date  . Diabetes mellitus without complication ([place])   . Hypertension     No family history on file.  Past Surgical History:  Procedure Laterality Date  . ABDOMINAL HYSTERECTOMY    . TONSILLECTOMY     Social History   Occupational History  . Not on file.   Social History Main Topics  . Smoking status: Never Smoker  . Smokeless tobacco: Never Used  . Alcohol use No  . Drug use: No  . Sexual activity: Not on file

## 2017-02-10 MED ORDER — LIDOCAINE HCL 1 % IJ SOLN
0.3000 mL | INTRAMUSCULAR | Status: AC | PRN
  Administered 2017-02-10: .3 mL

## 2017-02-10 MED ORDER — METHYLPREDNISOLONE ACETATE 40 MG/ML IJ SUSP
13.3300 mg | INTRAMUSCULAR | Status: AC | PRN
  Administered 2017-02-10: 13.33 mg

## 2017-02-10 MED ORDER — BUPIVACAINE HCL 0.25 % IJ SOLN
0.3300 mL | INTRAMUSCULAR | Status: AC | PRN
  Administered 2017-02-10: .33 mL

## 2017-02-21 ENCOUNTER — Ambulatory Visit (INDEPENDENT_AMBULATORY_CARE_PROVIDER_SITE_OTHER): Payer: Worker's Compensation | Admitting: Orthopaedic Surgery

## 2017-02-21 ENCOUNTER — Encounter (INDEPENDENT_AMBULATORY_CARE_PROVIDER_SITE_OTHER): Payer: Self-pay | Admitting: Orthopaedic Surgery

## 2017-02-21 VITALS — BP 111/61 | HR 74 | Ht 63.0 in | Wt 147.0 lb

## 2017-02-21 DIAGNOSIS — M654 Radial styloid tenosynovitis [de Quervain]: Secondary | ICD-10-CM | POA: Diagnosis not present

## 2017-02-21 NOTE — Addendum Note (Signed)
Addended by: Meyer Cory on: 02/21/2017 02:42 PM   Modules accepted: Orders

## 2017-02-21 NOTE — Progress Notes (Signed)
Office Visit Note   Patient: Shari Ortiz           Date of Birth: 1950-12-23           MRN: [id number] Visit Date: 02/21/2017              Requested by: Lucianne Lei, MD [address] [place], [place] [address] PCP: Lucianne Lei, MD   Assessment & Plan: Visit Diagnoses:  1. De Quervain's tenosynovitis, right     Plan: Patient initially got partial relief from the first dorsal compartment injection with less pain relief than expected ref injection and really no improvement in her symptoms from the injection she reports to me. I recommend proceeding with MRI of the wrist for evaluation of the [facility] joint as well as first dorsal compartment dorsal compartment since she's not responded to injection and splinting. Office follow-up after scan.  Follow-Up Instructions: No Follow-up on file.   Orders:  No orders of the defined types were placed in this encounter.  No orders of the defined types were placed in this encounter.     Procedures: No procedures performed   Clinical Data: No additional findings.   Subjective: Chief Complaint  Patient presents with  . Right Wrist - Follow-up    HPI 66 year old female returns for follow-up of on-the-job injury when she injured her right dominant wrist with radial sided wrist pain. She's had pain with gripping and squeezing. She was injured at work on 12/27/2016 when she was she was reaching for a cone pulling it back and states her wrist went back into extension sharp pain in her wrist at that time. She's been treated with the Velcro splint which she continues to wear. She's had a prednisone pack, anti-inflammatories used ice intermittently. She has had consistent pain with gripping and squeezing in her left hand. She has continued to work using her brace. She has some vacation days coming out.  Review of Systems 14 point review of systems updated of note is positive history for diabetes, hypertension. Patient's a  nonsmoker.   Objective: Vital Signs: BP 111/61   Pulse 74   Ht 5\' 3"  (1.6 m)   Wt 147 lb (66.7 kg)   BMI 26.04 kg/m   Physical Exam  Constitutional: She is oriented to person, place, and time. She appears well-developed.  HENT:  Head: Normocephalic.  Right Ear: External ear normal.  Left Ear: External ear normal.  Eyes: Pupils are equal, round, and reactive to light.  Neck: No tracheal deviation present. No thyromegaly present.  Cardiovascular: Normal rate.   Pulmonary/Chest: Effort normal.  Abdominal: Soft.  Musculoskeletal:  Patient has a continued pain with gripping pain with flexion of the MP joint of the thumb pain with the positive thumb grind test and tenderness at the carpometacarpal junction. She still tender over the first dorsal compartment and there is some prominence of the first dorsal compartment on the right hand compared to the left hand. No thenar or hyperthenar weakness no evidence of triggering of the thumb or other fingers. Sensation her fingertips is intact. No clubbing normal. She has some discomfort with wrist flexion and extension. Third through sixth dorsal compartments are normal with palpation no wrist synovitis noted at the radiocarpal joint. Tuberosity the scaphoid is nontender. No tenderness with snuffbox palpation but tenderness over the carpometacarpal joint and first dorsal compartment.  Neurological: She is alert and oriented to person, place, and time.  Skin: Skin is warm and dry.  Psychiatric: She has a normal mood and affect. Her behavior is normal.    Ortho Exam  Specialty Comments:  No specialty comments available.  Imaging: No results found.   PMFS History: Patient Active Problem List   Diagnosis Date Noted  . De Quervain's tenosynovitis, right 02/07/2017   Past Medical History:  Diagnosis Date  . Diabetes mellitus without complication ([place])   . Hypertension     No family history on file.  Past Surgical History:  Procedure  Laterality Date  . ABDOMINAL HYSTERECTOMY    . TONSILLECTOMY     Social History   Occupational History  . Not on file.   Social History Main Topics  . Smoking status: Never Smoker  . Smokeless tobacco: Never Used  . Alcohol use No  . Drug use: No  . Sexual activity: Not on file

## 2017-03-07 ENCOUNTER — Telehealth (INDEPENDENT_AMBULATORY_CARE_PROVIDER_SITE_OTHER): Payer: Self-pay

## 2017-03-07 NOTE — Telephone Encounter (Signed)
Can you please advise? Per the last office note, patient had been continuing to work while wearing her brace. Thanks.

## 2017-03-07 NOTE — Telephone Encounter (Signed)
pts employer needs current work status from the 02/21/17 office visit.

## 2017-03-07 NOTE — Telephone Encounter (Signed)
Ucall. OK to continue , call pt and see if she feels she can work with the spint on then OK with that. If not then OOW til ROV after MRI.

## 2017-03-08 NOTE — Telephone Encounter (Signed)
Pt can continue regular work while wearing her brace. She is still awaiting MRI approval. Thanks.

## 2017-03-13 ENCOUNTER — Encounter (INDEPENDENT_AMBULATORY_CARE_PROVIDER_SITE_OTHER): Payer: Self-pay

## 2017-03-13 NOTE — Telephone Encounter (Signed)
Spoke with Pola Corn @ [employer] and advised him on work status. Emailed him the work note and office note per his request [email or web address] [phone]

## 2017-03-19 ENCOUNTER — Telehealth (INDEPENDENT_AMBULATORY_CARE_PROVIDER_SITE_OTHER): Payer: Self-pay | Admitting: Orthopaedic Surgery

## 2017-03-19 NOTE — Telephone Encounter (Signed)
Shari Ortiz with [employer] called advised they are trying to reschedule  Patient for MRI. Patient was to claustrophobic to do the MRI. Shari Ortiz said they are making there 1st attempt today.  The number to contact [employer] is [phone]

## 2017-03-19 NOTE — Telephone Encounter (Signed)
fyi

## 2017-03-20 NOTE — Telephone Encounter (Signed)
Ucall. OK for valium 5 mg # 3 . Thanks see if you can get another provider to sign if pt does not have , thanks

## 2017-03-20 NOTE — Telephone Encounter (Signed)
Fyi. Patient declined Valium. She states that there is no way that she can go into the closed unit and that once her head started in she "about had a heart attack". She states that [employer] is trying to find an open unit for the scan.

## 2017-04-04 ENCOUNTER — Telehealth (INDEPENDENT_AMBULATORY_CARE_PROVIDER_SITE_OTHER): Payer: Self-pay | Admitting: Radiology

## 2017-04-04 NOTE — Telephone Encounter (Signed)
Patient called and would like a return call with her MRI results.  I am not sure where MRI was done, will need to look into this and then call her- we are pending receipt of MRI results.

## 2017-04-05 ENCOUNTER — Telehealth (INDEPENDENT_AMBULATORY_CARE_PROVIDER_SITE_OTHER): Payer: Self-pay

## 2017-04-05 NOTE — Telephone Encounter (Signed)
MRI results to be faxed to [place] per [facility] rep, see other message.

## 2017-04-05 NOTE — Telephone Encounter (Signed)
Shari Ortiz with [facility] will be faxing patient's MRI results and would like for patient to have a F/U appt.  CB# is [phone].

## 2017-04-05 NOTE — Telephone Encounter (Signed)
Called pt and scheduled her an appt on 04/11/17. Called Claiborne Billings back @ [place] and advised.

## 2017-04-08 NOTE — Telephone Encounter (Signed)
Will you please call her and tell her that Shari Ortiz will review the MRI with her in [facility] Thursday?

## 2017-04-09 ENCOUNTER — Telehealth (INDEPENDENT_AMBULATORY_CARE_PROVIDER_SITE_OTHER): Payer: Self-pay | Admitting: Orthopaedic Surgery

## 2017-04-09 NOTE — Telephone Encounter (Signed)
Called patient left message to return call   [phone]

## 2017-04-09 NOTE — Telephone Encounter (Signed)
Called patient left message to return call  [phone]

## 2017-04-11 ENCOUNTER — Ambulatory Visit (INDEPENDENT_AMBULATORY_CARE_PROVIDER_SITE_OTHER): Payer: Worker's Compensation | Admitting: Orthopaedic Surgery

## 2017-04-11 ENCOUNTER — Encounter (INDEPENDENT_AMBULATORY_CARE_PROVIDER_SITE_OTHER): Payer: Self-pay | Admitting: Orthopaedic Surgery

## 2017-04-11 VITALS — BP 106/58 | HR 82

## 2017-04-11 DIAGNOSIS — M1811 Unilateral primary osteoarthritis of first carpometacarpal joint, right hand: Secondary | ICD-10-CM

## 2017-04-11 DIAGNOSIS — M654 Radial styloid tenosynovitis [de Quervain]: Secondary | ICD-10-CM

## 2017-04-11 DIAGNOSIS — M67431 Ganglion, right wrist: Secondary | ICD-10-CM

## 2017-04-11 NOTE — Progress Notes (Signed)
Office Visit Note   Patient: Shari Ortiz           Date of Birth: 08/17/1951           MRN: [id number] Visit Date: 04/11/2017              Requested by: Lucianne Lei, MD [address] [place], [place] [address] PCP: Lucianne Lei, MD   Assessment & Plan: Visit Diagnoses:  1. De Quervain's tenosynovitis, right   2. Ganglion cyst of volar aspect of right wrist   3. Arthritis of carpometacarpal [facility]) joint of right thumb     Plan:   first [place] joint injected with cortisone and Marcaine. She got some relief of pain. She is still tender over the first dorsal compartment. She has minimal tenderness over the volar wrist capsule.We will see she does with the injection and see her back again in 3 weeks.  Follow-Up Instructions: Return in about 3 weeks (around 05/02/2017).   Orders:  Orders Placed This Encounter  Procedures  . Small Joint Injection/Arthrocentesis   No orders of the defined types were placed in this encounter.     Procedures: Small Joint Inj Date/Time: 04/11/2017 11:09 AM Performed by: Marybelle Killings Authorized by: Marybelle Killings   Location:  Thumb Site:  R thumb CMC Needle Size:  25 G Approach:  Dorsal Ultrasound Guided: No   Fluoroscopic Guidance: No   Medications:  0.3 mL lidocaine (PF) 1 %; 0.5 mL bupivacaine 0.5 %; 20 mg methylPREDNISolone acetate 40 MG/ML Aspiration Attempted: No       Clinical Data: No additional findings.   Subjective: Chief Complaint  Patient presents with  . Right Wrist - Follow-up, Pain    HPI Patient returns with the follow-up of on-the-job injury with persistent right wrist pain. She had previous first dorsal compartment injection with the really no relief. She's had an MRI scan which is available for review. This shows marked first [place] degenerative changes. There is increased signal in the first dorsal compartment with tendinosis and Tenosynovitis. She also has some changes in the median nerve suggestive of median  neuropathy. Small volar ganglion is noted at the volar aspect of the distal radius which measures [id number] cm. Review of Systems 14 point review of systems is updated and is unchanged from 02/21/2017 except as mentioned in history of present illness   Objective: Vital Signs: BP (!) 106/58   Pulse 82   Physical Exam  Constitutional: She is oriented to person, place, and time. She appears well-developed.  HENT:  Head: Normocephalic.  Right Ear: External ear normal.  Left Ear: External ear normal.  Eyes: Pupils are equal, round, and reactive to light.  Neck: No tracheal deviation present. No thyromegaly present.  Cardiovascular: Normal rate.   Pulmonary/Chest: Effort normal.  Abdominal: Soft.  Neurological: She is alert and oriented to person, place, and time.  Skin: Skin is warm and dry.  Psychiatric: She has a normal mood and affect. Her behavior is normal.    Ortho Exam patient has tenderness over the first dorsal compartment. She's tender over the right CMC joint. Positive grind test. I'm unable to palpate the ganglion over the volar wrist joint. FCR is strong. Sensation the fingertip is intact.  Specialty Comments:  No specialty comments available.  Imaging: No results found.   PMFS History: Patient Active Problem List   Diagnosis Date Noted  . De Quervain's tenosynovitis, right 02/07/2017   Past Medical History:  Diagnosis  Date  . Diabetes mellitus without complication ([place])   . Hypertension     No family history on file.  Past Surgical History:  Procedure Laterality Date  . ABDOMINAL HYSTERECTOMY    . TONSILLECTOMY     Social History   Occupational History  . Not on file.   Social History Main Topics  . Smoking status: Never Smoker  . Smokeless tobacco: Never Used  . Alcohol use No  . Drug use: No  . Sexual activity: Not on file

## 2017-04-24 ENCOUNTER — Telehealth (INDEPENDENT_AMBULATORY_CARE_PROVIDER_SITE_OTHER): Payer: Self-pay

## 2017-04-24 NOTE — Telephone Encounter (Signed)
Faxed last 2 work notes to employer per their request

## 2017-04-29 DIAGNOSIS — M1811 Unilateral primary osteoarthritis of first carpometacarpal joint, right hand: Secondary | ICD-10-CM | POA: Insufficient documentation

## 2017-04-29 DIAGNOSIS — M67431 Ganglion, right wrist: Secondary | ICD-10-CM | POA: Insufficient documentation

## 2017-04-29 MED ORDER — LIDOCAINE HCL (PF) 1 % IJ SOLN
0.3000 mL | INTRAMUSCULAR | Status: AC | PRN
  Administered 2017-04-11: .3 mL

## 2017-04-29 MED ORDER — BUPIVACAINE HCL 0.5 % IJ SOLN
0.5000 mL | INTRAMUSCULAR | Status: AC | PRN
  Administered 2017-04-11: .5 mL via INTRA_ARTICULAR

## 2017-04-29 MED ORDER — METHYLPREDNISOLONE ACETATE 40 MG/ML IJ SUSP
20.0000 mg | INTRAMUSCULAR | Status: AC | PRN
  Administered 2017-04-11: 20 mg via INTRA_ARTICULAR

## 2017-05-02 ENCOUNTER — Ambulatory Visit (INDEPENDENT_AMBULATORY_CARE_PROVIDER_SITE_OTHER): Payer: Self-pay

## 2017-05-02 ENCOUNTER — Ambulatory Visit (INDEPENDENT_AMBULATORY_CARE_PROVIDER_SITE_OTHER): Payer: Worker's Compensation | Admitting: Orthopaedic Surgery

## 2017-05-02 ENCOUNTER — Encounter (INDEPENDENT_AMBULATORY_CARE_PROVIDER_SITE_OTHER): Payer: Self-pay | Admitting: Orthopaedic Surgery

## 2017-05-02 VITALS — BP 114/60 | HR 80 | Ht 63.0 in | Wt 147.0 lb

## 2017-05-02 DIAGNOSIS — M542 Cervicalgia: Secondary | ICD-10-CM

## 2017-05-02 DIAGNOSIS — M5030 Other cervical disc degeneration, unspecified cervical region: Secondary | ICD-10-CM

## 2017-05-02 DIAGNOSIS — M1811 Unilateral primary osteoarthritis of first carpometacarpal joint, right hand: Secondary | ICD-10-CM

## 2017-05-02 DIAGNOSIS — M67431 Ganglion, right wrist: Secondary | ICD-10-CM

## 2017-05-02 DIAGNOSIS — M654 Radial styloid tenosynovitis [de Quervain]: Secondary | ICD-10-CM

## 2017-05-02 MED ORDER — TRAMADOL HCL 50 MG PO TABS: 50.0000 mg | ORAL_TABLET | Freq: Every evening | ORAL | 0 refills | Status: DC | PRN

## 2017-05-02 NOTE — Addendum Note (Signed)
Addended by: Meyer Cory on: 05/02/2017 03:13 PM   Modules accepted: Orders

## 2017-05-02 NOTE — Addendum Note (Signed)
Addended by: Meyer Cory on: 05/02/2017 03:40 PM   Modules accepted: Orders

## 2017-05-02 NOTE — Progress Notes (Signed)
Office Visit Note   Patient: Shari Ortiz           Date of Birth: 06/26/1951           MRN: [id number] Visit Date: 05/02/2017              Requested by: Lucianne Lei, [address] [place], [place] [address] PCP: Lucianne Lei, MD   Assessment & Plan: Visit Diagnoses:  1. Neck pain   2. De Quervain's tenosynovitis, right   3. Arthritis of carpometacarpal (CMC) joint of right thumb   4. Ganglion cyst of volar aspect of right wrist   5. Other cervical disc degeneration, unspecified cervical region     Plan: I will proceed with EMGs nerve conduction velocities. There symptoms may be related to her neck versus carpal tunnel compression. We reviewed the MRI scan and she brought her disc with her today. She does have the ganglion there is some flattening of the median nerve. Targeted to determine if her pain primarily is coming from cervical spine or from the carpal canal or possibly related to the ganglion. She did have some positive findings first dorsal compartment but did not get relief with injection in that area is not as tender currently. The first [facility] joint which had positive findings on physical exam injection did not really give her any relief even after the injection. Office follow-up after electrical test.  Follow-Up Instructions: No Follow-up on file.   Orders:  Orders Placed This Encounter  Procedures  . XR Cervical Spine 2 or 3 views   No orders of the defined types were placed in this encounter.     Procedures: No procedures performed   Clinical Data: No additional findings.   Subjective: Chief Complaint  Patient presents with  . Right Wrist - Pain, Follow-up    HPI 66-year-old female returns with ongoing problems with the right wrist pain. She's been using a splint she still working. Pain bothers her at nighttime she has to get up the middle of night apply ice shake her hand takes a while and then she is able fall back asleep. MRI scan was reviewed  with her today she brought her disc. This shows a volar ganglion adjacent to the radial artery FCR which does not show any tendinopathy changes. Ganglion is not adjacent median nerve. There is some flattening of the median nerve and it looks smaller than normal which may suggest some median compression. He drops objects. She still has pain over the radial side of her hand that radiates up and follows the first dorsal compartment but the injection in this as well as injection at her [facility] joint did not give her any significant relief.  Review of Systems 14 point review of systems updated and is unchanged from last office visit other than as mentioned in history of present illness  Objective: Vital Signs: BP 114/60   Pulse 80   Ht 5\' 3"  (1.6 m)   Wt 147 lb (66.7 kg)   BMI 26.04 kg/m   Physical Exam  Constitutional: She is oriented to person, place, and time. She appears well-developed.  HENT:  Head: Normocephalic.  Right Ear: External ear normal.  Left Ear: External ear normal.  Eyes: Pupils are equal, round, and reactive to light.  Neck: No tracheal deviation present. No thyromegaly present.  Cardiovascular: Normal rate.   Pulmonary/Chest: Effort normal.  Abdominal: Soft.  Neurological: She is alert and oriented to person, place, and time.  Skin: Skin is warm and dry.  Psychiatric: She has a normal mood and affect. Her behavior is normal.    Ortho Exam says mild discomfort cervical compression no significant relief with distraction some discomfort with extension for flexion chin 2 finger breast in the chest. She has brachial plexus tenderness more significant on the right than left she has some tenderness palpation over the radial artery at the wrist and the the ganglion is not definitely palpable. Pain with carpal compression. Some numbness in her fingers with positive Phalen's test. First dose compartment is still tender still is tenderness with palpation over the first [facility]  joint.  Specialty Comments:  No specialty comments available.  Imaging: Xr Cervical Spine 2 Or 3 Views  Result Date: 05/02/2017 AP and lateral cervical spine x-rays obtained and reviewed. This shows narrowing at C6-7 with the vertebral body spurring 50% narrowing of the disc space. Some facet changes on AP x-ray and uncovertebral hypertrophy at C6-7. Impression: Cervical spondylosis with some narrowing at C6-7. No anterolisthesis.    PMFS History: Patient Active Problem List   Diagnosis Date Noted  . Ganglion cyst of volar aspect of right wrist 04/29/2017  . Arthritis of carpometacarpal [facility]) joint of right thumb 04/29/2017  . De Quervain's tenosynovitis, right 02/07/2017   Past Medical History:  Diagnosis Date  . Diabetes mellitus without complication ([place])   . Hypertension     No family history on file.  Past Surgical History:  Procedure Laterality Date  . ABDOMINAL HYSTERECTOMY    . TONSILLECTOMY     Social History   Occupational History  . Not on file.   Social History Main Topics  . Smoking status: Never Smoker  . Smokeless tobacco: Never Used  . Alcohol use No  . Drug use: No  . Sexual activity: Not on file

## 2017-05-14 ENCOUNTER — Other Ambulatory Visit: Payer: Self-pay | Admitting: Family Medicine

## 2017-05-14 DIAGNOSIS — Z1231 Encounter for screening mammogram for malignant neoplasm of breast: Secondary | ICD-10-CM

## 2017-06-13 ENCOUNTER — Telehealth (INDEPENDENT_AMBULATORY_CARE_PROVIDER_SITE_OTHER): Payer: Self-pay | Admitting: Orthopaedic Surgery

## 2017-06-13 NOTE — Telephone Encounter (Signed)
Records faxed to [facility] [phone], vm request Sharee Pimple

## 2017-06-19 ENCOUNTER — Ambulatory Visit: Payer: Managed Care, Other (non HMO)

## 2017-06-27 ENCOUNTER — Ambulatory Visit (INDEPENDENT_AMBULATORY_CARE_PROVIDER_SITE_OTHER): Payer: Self-pay | Admitting: Orthopaedic Surgery

## 2017-06-27 ENCOUNTER — Ambulatory Visit
Admission: RE | Admit: 2017-06-27 | Discharge: 2017-06-27 | Disposition: A | Discharge status: Home / Self Care | Payer: Managed Care, Other (non HMO) | Source: Ambulatory Visit | Attending: Family Medicine | Admitting: Family Medicine

## 2017-06-27 DIAGNOSIS — Z1231 Encounter for screening mammogram for malignant neoplasm of breast: Secondary | ICD-10-CM

## 2017-06-28 ENCOUNTER — Other Ambulatory Visit: Payer: Self-pay | Admitting: Family Medicine

## 2017-06-28 DIAGNOSIS — R928 Other abnormal and inconclusive findings on diagnostic imaging of breast: Secondary | ICD-10-CM

## 2017-07-03 ENCOUNTER — Other Ambulatory Visit: Payer: Managed Care, Other (non HMO)

## 2017-07-04 ENCOUNTER — Encounter (INDEPENDENT_AMBULATORY_CARE_PROVIDER_SITE_OTHER): Payer: Self-pay | Admitting: Orthopaedic Surgery

## 2017-07-04 ENCOUNTER — Ambulatory Visit (INDEPENDENT_AMBULATORY_CARE_PROVIDER_SITE_OTHER): Payer: Worker's Compensation | Admitting: Orthopaedic Surgery

## 2017-07-04 VITALS — BP 135/68 | HR 78

## 2017-07-04 DIAGNOSIS — M1811 Unilateral primary osteoarthritis of first carpometacarpal joint, right hand: Secondary | ICD-10-CM | POA: Diagnosis not present

## 2017-07-04 DIAGNOSIS — M654 Radial styloid tenosynovitis [de Quervain]: Secondary | ICD-10-CM | POA: Diagnosis not present

## 2017-07-04 DIAGNOSIS — M67431 Ganglion, right wrist: Secondary | ICD-10-CM | POA: Diagnosis not present

## 2017-07-04 DIAGNOSIS — G5601 Carpal tunnel syndrome, right upper limb: Secondary | ICD-10-CM | POA: Diagnosis not present

## 2017-07-04 NOTE — Progress Notes (Signed)
o-Saved) 4:28 PM   NoteWriter selections for this note were unable to be saved. Complete documentation in a new note, and then delete this note.         Office Visit Note              Patient: Shari Ortiz                                            Date of Birth: Aug 01, 1951                                                   MRN: [id number] Visit Date: 07/04/2017                                                                     Requested by: Lucianne Lei, [address] [place], [place] [address] PCP: Lucianne Lei, MD   Assessment & Plan: Visit Diagnoses: right wrist pain. CTS  Plan: carpal tunnel injection done right wrist. Will recheck her in 3 wks. Voltaren gel apply three times a day. Return 3 wks.   Follow-Up Instructions: Return in about 3 weeks (around 07/25/2017).   Orders:  No orders of the defined types were placed in this encounter.  No orders of the defined types were placed in this encounter.     Procedures: No procedures performed   Clinical Data: No additional findings.   Subjective:     Chief Complaint  Patient presents with  . Neck - Pain, Follow-up    EMG/NCS review    HPI 66 year old female returns with ongoing pain in her right wrist.  She is working with the wrist splint on.  She has had persistent pain on the radial side of her hand pain over the thenar muscle numbness of bothers her at night, MRI documented small ganglion cyst on the volar aspect of the wrist not causing any nerve compression, first dorsal compartment tenosynovitis which did not get any better whatsoever with the injection and now she returns after EMG and NCV, of the right wrist for evaluation for possible carpal tunnel.  Patient's had no evidence of symptom magnification, she is work for 46 years suppresses a desire to get better.  Available for review done at [facility] pain management show evidence of mild carpal tunnel syndrome with sensory  involvement.  Review of Systems unchanged from last office visit.   Objective: Vital Signs: BP 135/68   Pulse 78   Physical Exam  Constitutional: She is oriented to person, place, and time. She appears well-developed.  HENT:  Head: Normocephalic.  Right Ear: External ear normal.  Left Ear: External ear normal.  Eyes: Pupils are equal, round, and reactive to light.  Neck: No tracheal deviation present. No thyromegaly present.  Cardiovascular: Normal rate.  Pulmonary/Chest: Effort normal.  Abdominal: Soft.  Neurological: She is alert and oriented to person, place, and time.  Skin: Skin is warm and dry.  Psychiatric: She has a normal mood  and affect. Her behavior is normal.    Ortho Exam there is minimal symmetrical tenderness of the brachial plexus right and left no pain increase with cervical compression no change with distraction negative Spurling negative for me.  Upper extremities right and left are 2+ biceps triceps brachioradialis.  She is tender over the first [facility] joint, tender across the dorsal wrist capsule.  Tender adjacent to the radial artery with a ganglion is present on MRI scan and tender with palpation over the carpal canal with positive carpal compression test and tenderness over the base of the thenar musculature.  No atrophy of the thenar or hyperthenar muscles.  She has slight numbness at the index  Specialty Comments:  No specialty comments available.  Imaging: No results found.   PMFS History:     Patient Active Problem List   Diagnosis Date Noted  . Ganglion cyst of volar aspect of right wrist 04/29/2017  . Arthritis of carpometacarpal [facility]) joint of right thumb 04/29/2017  . De Quervain's tenosynovitis, right 02/07/2017       Past Medical History:  Diagnosis Date  . Diabetes mellitus without complication ([place])   . Hypertension     No family history on file.       Past Surgical History:  Procedure Laterality Date  . ABDOMINAL  HYSTERECTOMY    . TONSILLECTOMY     Social History       Occupational History  . Not on file  Tobacco Use  . Smoking status: Never Smoker  . Smokeless tobacco: Never Used  Substance and Sexual Activity  . Alcohol use: No  . Drug use: No  . Sexual activity: Not on file

## 2017-07-08 NOTE — Telephone Encounter (Signed)
Faxed note to [facility] @ [employer]  [phone]

## 2017-07-08 NOTE — Telephone Encounter (Signed)
-----   Message from Marlaine Hind, [place] sent at 07/01/2017 12:50 PM EST ----- Fax the 07/04/17 office note for this patient to wc adj Marissa @ [employer] [phone]

## 2017-07-11 ENCOUNTER — Telehealth (INDEPENDENT_AMBULATORY_CARE_PROVIDER_SITE_OTHER): Payer: Self-pay | Admitting: Orthopaedic Surgery

## 2017-07-11 NOTE — Telephone Encounter (Signed)
Is it ok for me to talk with her?  Patient is supposed to follow up in three weeks with Dr. Lorin Mercy. I will be glad to call her if that is ok. Thanks

## 2017-07-11 NOTE — Telephone Encounter (Signed)
I called Daleen Snook and advised. Patient still under Dr. Lorin Mercy care.

## 2017-07-11 NOTE — Telephone Encounter (Signed)
Shari Ortiz with [facility] called asked if the patient was released by Dr Lorin Mercy. The number to contact Daleen Snook is [phone]

## 2017-07-15 ENCOUNTER — Ambulatory Visit
Admission: RE | Admit: 2017-07-15 | Discharge: 2017-07-15 | Disposition: A | Discharge status: Home / Self Care | Payer: Managed Care, Other (non HMO) | Source: Ambulatory Visit | Attending: Family Medicine | Admitting: Family Medicine

## 2017-07-15 ENCOUNTER — Other Ambulatory Visit: Payer: Self-pay | Admitting: Family Medicine

## 2017-07-15 DIAGNOSIS — R928 Other abnormal and inconclusive findings on diagnostic imaging of breast: Secondary | ICD-10-CM

## 2017-07-15 DIAGNOSIS — N611 Abscess of the breast and nipple: Secondary | ICD-10-CM

## 2017-07-25 ENCOUNTER — Ambulatory Visit (INDEPENDENT_AMBULATORY_CARE_PROVIDER_SITE_OTHER): Payer: Worker's Compensation | Admitting: Orthopaedic Surgery

## 2017-07-25 ENCOUNTER — Encounter (INDEPENDENT_AMBULATORY_CARE_PROVIDER_SITE_OTHER): Payer: Self-pay | Admitting: Orthopaedic Surgery

## 2017-07-25 VITALS — BP 100/55 | HR 85 | Ht 64.0 in | Wt 148.0 lb

## 2017-07-25 DIAGNOSIS — M67431 Ganglion, right wrist: Secondary | ICD-10-CM

## 2017-07-25 DIAGNOSIS — M1811 Unilateral primary osteoarthritis of first carpometacarpal joint, right hand: Secondary | ICD-10-CM

## 2017-07-25 DIAGNOSIS — M654 Radial styloid tenosynovitis [de Quervain]: Secondary | ICD-10-CM

## 2017-07-25 NOTE — Progress Notes (Signed)
Office Visit Note   Patient: Shari Ortiz           Date of Birth: 11/09/1950           MRN: [id number] Visit Date: 07/25/2017              Requested by: Lucianne Lei, [address] [place], [place] [address] PCP: Lucianne Lei, MD   Assessment & Plan: Visit Diagnoses:  1. Arthritis of carpometacarpal (CMC) joint of right thumb   2. De Quervain's tenosynovitis, right   3. Ganglion cyst of volar aspect of right wrist     Plan: Patient is continuing to work especially can.  At this point I like to have her evaluated at the hand center.  She does have a smaller volar wrist ganglion she does have some findings consistent with first dorsal compartment tenosynovitis.  She has some mild carpal tunnel syndrome.  Cervical MRI scan but her plain radiographs demonstrate mild narrowing at C6-7 normal curvature and physical exam findings of her neck are unimpressive.  We will request workers [employer] for evaluation/consultation/treatment.  I am happy to see her back as needed.  I reviewed today with the patient previous treatment, test results and scans.  Follow-Up Instructions: No Follow-up on file.   Orders:  No orders of the defined types were placed in this encounter.  No orders of the defined types were placed in this encounter.     Procedures: No procedures performed   Clinical Data: No additional findings.   Subjective: Chief Complaint  Patient presents with  . Right Wrist - Pain    HPI 66 year old female returns with ongoing pain in her right wrist and hand and works at [employer] for 45 years approximately.  She is continuing to work using a wrist splint.  She has been seen and evaluated and regionally it looks like she had first dorsal compartment tenosynovitis.  Has had that injected as well as splint usage.  She has had first [facility] joint injected where she has some degenerative changes.  MRI scan done at no [facility] scanner showed some small volar wrist  ganglion and some flattening of the median nerve in the carpal canal.  She has had a carpal tunnel injection still use a wrist splint and has not gotten relief.  She has been on different medications without improvement.  Is not on any narcotic medication.  She does have diabetes mellitus and hypertension.  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 Corinna Capra.  After the carpal tunnel injection 07/04/2017 states she did not getting relief she did have some vomiting and diarrhea not sure if some food that she ate but this resolved.  Cervical spine x-rays demonstrated some mild narrowing at C6-7 with some spurring.  There is 12/27/2016 when she was reaching for a cone of urine behind her with extension of the wrist and sharp pain.  She has had several anti-inflammatories as well as prednisone pack prior to when I saw her first 02/07/2017.  Review of Systems for hypertension, diabetes, patient's a non-smoker.   Objective: Vital Signs: BP (!) 100/55   Pulse 85   Ht 5\' 4"  (1.626 m)   Wt 148 lb (67.1 kg)   BMI 25.40 kg/m   Physical Exam  Constitutional: She is oriented  to person, place, and time. She appears well-developed.  HENT:  Head: Normocephalic.  Right Ear: External ear normal.  Left Ear: External ear normal.  Eyes: Pupils are equal, round, and reactive to light.  Neck: No tracheal deviation present. No thyromegaly present.  Cardiovascular: Normal rate.  Pulmonary/Chest: Effort normal.  Abdominal: Soft.  Neurological: She is alert and oriented to person, place, and time.  Skin: Skin is warm and dry.  Psychiatric: She has a normal mood and affect. Her behavior is normal.  Patient remains pleasan,t cooperative, and motivated for improvement.  Ortho Exam patient has tenderness over the first dorsal compartment over  the volar wrist joints some across the dorsal radiocarpal joint.  Some pain with CMC loading and positive grind test.  She still has tenderness with pressure over the carpal canal.  No thenar atrophy.  Allen's test at the wrist shows normal ulnar and radial flow.  Ulnar nerve at the elbow is normal.  She has normal cervical range of motion no brachial plexus tenderness.  Specialty Comments:  No specialty comments available.  Imaging: No results found.   PMFS History: Patient Active Problem List   Diagnosis Date Noted  . Ganglion cyst of volar aspect of right wrist 04/29/2017  . Arthritis of carpometacarpal [facility]) joint of right thumb 04/29/2017  . De Quervain's tenosynovitis, right 02/07/2017   Past Medical History:  Diagnosis Date  . Diabetes mellitus without complication ([place])   . Hypertension     No family history on file.  Past Surgical History:  Procedure Laterality Date  . ABDOMINAL HYSTERECTOMY    . TONSILLECTOMY     Social History   Occupational History  . Not on file  Tobacco Use  . Smoking status: Never Smoker  . Smokeless tobacco: Never Used  Substance and Sexual Activity  . Alcohol use: No  . Drug use: No  . Sexual activity: Not on file

## 2017-07-25 NOTE — Addendum Note (Signed)
Addended by: Meyer Cory on: 07/25/2017 03:40 PM   Modules accepted: Orders

## 2017-07-26 ENCOUNTER — Telehealth (INDEPENDENT_AMBULATORY_CARE_PROVIDER_SITE_OTHER): Payer: Self-pay

## 2017-07-26 NOTE — Telephone Encounter (Signed)
-----   Message from Marybelle Killings, MD sent at 07/25/2017  3:00 PM EST ----- Be to [facility]. and seek approval for referral to hand center surgeon for consultation thank you

## 2017-07-26 NOTE — Telephone Encounter (Signed)
Faxed yesterdays office note and referral order to [facility] @ [phone]

## 2017-08-14 ENCOUNTER — Telehealth (INDEPENDENT_AMBULATORY_CARE_PROVIDER_SITE_OTHER): Payer: Self-pay

## 2017-08-14 NOTE — Telephone Encounter (Signed)
New wc adj is [facility] @ [phone]

## 2017-08-23 ENCOUNTER — Telehealth (INDEPENDENT_AMBULATORY_CARE_PROVIDER_SITE_OTHER): Payer: Self-pay | Admitting: Orthopaedic Surgery

## 2017-08-23 NOTE — Telephone Encounter (Signed)
error 

## 2017-08-28 ENCOUNTER — Telehealth (INDEPENDENT_AMBULATORY_CARE_PROVIDER_SITE_OTHER): Payer: Self-pay | Admitting: Orthopaedic Surgery

## 2017-08-28 NOTE — Telephone Encounter (Signed)
OK to do Dr. Veronia Beets who is  " in Network ", he is good thanks.

## 2017-08-28 NOTE — Telephone Encounter (Signed)
FYI

## 2017-08-28 NOTE — Telephone Encounter (Signed)
[place]   Doctor recommend for pt surgery is not in network. Remo Lipps called form [employer] and Dr.Gramig is in network.

## 2017-08-28 NOTE — Telephone Encounter (Signed)
Emailed Remo Lipps back to advise to let me know when pt is scheduled to see Dr. Amedeo Plenty so we can note the chart

## 2017-09-04 NOTE — Telephone Encounter (Signed)
Per email from [place], he is still working on getting pt set up to see Dr. Amedeo Plenty and he will let me know once appt is made

## 2017-09-05 ENCOUNTER — Telehealth (INDEPENDENT_AMBULATORY_CARE_PROVIDER_SITE_OTHER): Payer: Self-pay | Admitting: *Deleted

## 2017-09-05 NOTE — Telephone Encounter (Signed)
Pt called left message on vm stating she has a question about a bill that was suppose to be billed by [employer] that should be handled by them. Would like a call back from you.  Please call [phone]

## 2017-09-10 NOTE — Telephone Encounter (Signed)
Spoke with pt. She says she has turned the bill into [facility] and they said they would handle it.

## 2017-12-02 ENCOUNTER — Telehealth (INDEPENDENT_AMBULATORY_CARE_PROVIDER_SITE_OTHER): Payer: Self-pay

## 2017-12-02 NOTE — Telephone Encounter (Signed)
Happy to see her back. Amy please get copy of IME for review. thanks

## 2017-12-02 NOTE — Telephone Encounter (Signed)
Received the following email from wc adj. Ok to schedule rov with Dr. Lorin Mercy?  Greetings Zoella Roberti, Can I please schedule a return appointment for Ms. Degracia to see Dr. Lorin Mercy? She would like to proceed with the carpal tunnel surgery recommended by Dr. Amedeo Plenty. Dr. Vanetta Shawl office has however since clarified they are not specifically ordering the surgery, and that she was seen for an IME only. The purpose of the appointment will be for Dr. Lorin Mercy to address a potential surgical order vs an alternative treatment plan. Please let me know if any additional information is needed Thank you,  Joslyn Hy  [facility], [place], [place], [place] (P) [phone]  [email or web address]

## 2017-12-02 NOTE — Telephone Encounter (Signed)
Please advise on ROV.

## 2017-12-03 NOTE — Telephone Encounter (Signed)
Emailed Mickel Baas back and advised. Pt needs to call [place] office to have appt set up there is that is where she prefers to go. Also advised Mickel Baas to send me a copy of Dr. Vanetta Shawl note

## 2017-12-06 NOTE — Telephone Encounter (Signed)
Thank you, please find attached notes. I will advise her attorney she can schedule an appointment at her [facility], [address], [place], [place] (P) [phone]  [email or web address] Mickel Baas emailed me back   IME report given to [facility] for upcoming appt

## 2017-12-26 ENCOUNTER — Encounter (INDEPENDENT_AMBULATORY_CARE_PROVIDER_SITE_OTHER): Payer: Self-pay | Admitting: Orthopaedic Surgery

## 2017-12-26 ENCOUNTER — Ambulatory Visit (INDEPENDENT_AMBULATORY_CARE_PROVIDER_SITE_OTHER): Payer: Worker's Compensation | Admitting: Orthopaedic Surgery

## 2017-12-26 VITALS — BP 127/63 | HR 83 | Ht 64.0 in | Wt 146.0 lb

## 2017-12-26 DIAGNOSIS — M67431 Ganglion, right wrist: Secondary | ICD-10-CM

## 2017-12-26 DIAGNOSIS — G5601 Carpal tunnel syndrome, right upper limb: Secondary | ICD-10-CM

## 2017-12-26 DIAGNOSIS — M1811 Unilateral primary osteoarthritis of first carpometacarpal joint, right hand: Secondary | ICD-10-CM

## 2017-12-26 NOTE — Progress Notes (Signed)
Office Visit Note   Patient: Shari Ortiz           Date of Birth: 19-Sep-1950           MRN: [id number] Visit Date: 12/26/2017              Requested by: Shari Ortiz, Shari Ort[address] [place], [place] [address] PCP: Shari Lei, MD   Assessment & Plan: Visit Diagnoses:  1. Ganglion cyst of volar aspect of right wrist   2. Carpal tunnel syndrome, right upper limb   3. Arthritis of carpometacarpal (CMC) joint of right thumb     Plan: Patient said greater than 46-month history of persistent problems with the wrist after on-the-job injury.  She does have mild carpal tunnel syndrome on electrical test and flattening the median nerve on MRI scan.  Small volar wrist ganglion which does not appear to enter the carpal canal or compress the median nerve.  Plan would be outpatient carpal tunnel release.  She should be able to get back to work in 6 to 7 weeks after the procedure.  One-handed work was available she could start that a couple weeks after the procedure once she is off pain medication given postop.   Follow-Up Instructions: No follow-ups on file.   Orders:  No orders of the defined types were placed in this encounter.  No orders of the defined types were placed in this encounter.     Procedures: No procedures performed   Clinical Data: No additional findings.   Subjective: Chief Complaint  Patient presents with  . Right Wrist - Pain    HPI 67 year old female returns with ongoing wrist pain and hand numbness symptoms.  She is been wearing a splint chronically.  Electrical test showed changes consistent with carpal tunnel and she saw Dr. Amedeo Ortiz for an IME who would recommend proceeding with carpal tunnel release.  She has Shari Ortiz degenerative changes at the base of the thumb and the also discussed possible surgery for this in the future.  She has had previous injection CMC joint with minimal improvement.  She continues to work continues to have pain numbness that wakes her  up at night she has to shake her hand.  CMC injection was 04/11/2017.  Patient works at [employer] for more than 25 years with an on-the-job injury on 12/27/2016 with an extension injury to the right wrist..  She has used several anti-inflammatories, ice, splinting has had injections of the first dorsal compartment as well as CMC joint.  Carpal tunnel injection was done 07/04/2017 with temporary relief.  MRI scan of her wrist showed a small volar ganglion as well as flattening of the median nerve in the carpal canal.  EMGs nerve conduction velocity showed no evidence of radiculopathy in the upper extremity.  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 Corinna Ortiz.  Pain in her hand tends to wake her up at night she has to shake her hand and goes back to sleep she does wear her splint at night.  Review of Systems 14 point review of systems updated.  Patient does have history of diabetes, hysterectomy and tonsillectomy previously in the past.  Patient is a non-smoker.  Negative for gout or other rheumatologic conditions.   Objective: Vital Signs: BP 127/63   Pulse 83   Ht 5\' 4"  (1.626 m)   Wt 146 lb (66.2 kg)   BMI 25.06  kg/m   Physical Exam  Constitutional: She is oriented to person, place, and time. She appears well-developed.  HENT:  Head: Normocephalic.  Right Ear: External ear normal.  Left Ear: External ear normal.  Eyes: Pupils are equal, round, and reactive to light.  Neck: No tracheal deviation present. No thyromegaly present.  Cardiovascular: Normal rate.  Pulmonary/Chest: Effort normal.  Abdominal: Soft.  Neurological: She is alert and oriented to person, place, and time.  Skin: Skin is warm and dry.  Psychiatric: She has a normal mood and affect. Her behavior is normal.    Ortho Exam patient has no thenar or hyperthenar atrophy.  She has tenderness palpation of the dorsal wrist capsule as well as over the  carpal canal.  Mild tenderness over the first [facility] joint.  Median nerve at the forearm ulnar nerve at the elbow are normal.  No shoulder impingement.  No brachial plexus tenderness.  Upper extremity reflexes are 2+ and symmetrical.  Normal heel toe gait.  Specialty Comments:  No specialty comments available.  Imaging: No results found.   PMFS History: Patient Active Problem List   Diagnosis Date Noted  . Carpal tunnel syndrome, right upper limb 12/30/2017  . Ganglion cyst of volar aspect of right wrist 04/29/2017  . Arthritis of carpometacarpal [facility]) joint of right thumb 04/29/2017  . De Quervain's tenosynovitis, right 02/07/2017   Past Medical History:  Diagnosis Date  . Diabetes mellitus without complication ([place])   . Hypertension     History reviewed. No pertinent family history.  Past Surgical History:  Procedure Laterality Date  . ABDOMINAL HYSTERECTOMY    . TONSILLECTOMY     Social History   Occupational History  . Not on file  Tobacco Use  . Smoking status: Never Smoker  . Smokeless tobacco: Never Used  Substance and Sexual Activity  . Alcohol use: No  . Drug use: No  . Sexual activity: Not on file

## 2017-12-30 ENCOUNTER — Encounter (INDEPENDENT_AMBULATORY_CARE_PROVIDER_SITE_OTHER): Payer: Self-pay | Admitting: Orthopaedic Surgery

## 2017-12-30 DIAGNOSIS — G5601 Carpal tunnel syndrome, right upper limb: Secondary | ICD-10-CM | POA: Insufficient documentation

## 2018-01-06 ENCOUNTER — Telehealth (INDEPENDENT_AMBULATORY_CARE_PROVIDER_SITE_OTHER): Payer: Self-pay

## 2018-01-06 NOTE — Telephone Encounter (Signed)
Employer needs updated work note for patient

## 2018-01-06 NOTE — Telephone Encounter (Signed)
Emailed the 12/26/17 office note and surgery request to [facility]

## 2018-01-06 NOTE — Telephone Encounter (Signed)
Faxed the 12/26/17 office note per her request

## 2018-01-06 NOTE — Telephone Encounter (Signed)
Please advise 

## 2018-01-07 NOTE — Telephone Encounter (Signed)
Ok to send note, awaiting approval for carpal tunnel release. May be able to do one handed work a few weeks after surgery if it is available. Ucall, discuss and see if that takes care of what she needs. thanks

## 2018-01-07 NOTE — Telephone Encounter (Signed)
See Lorin Mercy note.

## 2018-01-08 ENCOUNTER — Telehealth (INDEPENDENT_AMBULATORY_CARE_PROVIDER_SITE_OTHER): Payer: Self-pay

## 2018-01-08 NOTE — Telephone Encounter (Signed)
FYI-Received fax from [facility] stating carpal tunnel release is being reviewed by peer reviewer and they wanted times that Dr, Lorin Mercy was available to discuss and it had to be before 01/10/18. I gave them the # for [facility] and his clinic  times for tomorrow. H)[phone]

## 2018-01-08 NOTE — Telephone Encounter (Signed)
fyi

## 2018-01-09 NOTE — Telephone Encounter (Signed)
Does not make sense, already had 2nd opinion that stated she needs CTR. I called # , tried to give them my cell phone # , she would not take it and said we had to fax back to them my # and times I am available. I am available at any time. My cell [phone]. Not sure what the adjuster is thinking when she already has 2nd opinion recommending CTR .  Pt has had injection, splints, NSAIDS, pos electrical test. She is wasting money. Proceed with fax in thanks. You can let adjuster know doc says this is delaying care .

## 2018-01-09 NOTE — Telephone Encounter (Signed)
Left message for Shari Ortiz to call me back to discuss

## 2018-01-09 NOTE — Telephone Encounter (Signed)
I faxed the form back to them yesterday that gave the [facility] ph# and his clinic hours for today. It said that the review had to be done by 01/10/18. I just faxed it back again and put that Dr. Lorin Mercy could be reached at anytime on his cell and gave the #

## 2018-01-15 ENCOUNTER — Telehealth (INDEPENDENT_AMBULATORY_CARE_PROVIDER_SITE_OTHER): Payer: Self-pay

## 2018-01-15 NOTE — Telephone Encounter (Signed)
I emailed [facility] adj to f/u on surgery auth and she says she is unsure it pt wants to proceed. Pt was concerned that Dr Amedeo Plenty had recommended the surgical reconstruction of the carpometacarpal joint of the right thumb as well, and if she was going to need that surgery she would want it to be done at the same time as the carpal tunnel surgery.  Wants to know if you agree with the thumb surgery recommendation?

## 2018-01-15 NOTE — Telephone Encounter (Signed)
Her choice, she can seek approval for him to do both  if she desires.

## 2018-01-16 ENCOUNTER — Other Ambulatory Visit: Payer: Self-pay | Admitting: Family Medicine

## 2018-01-16 ENCOUNTER — Encounter: Payer: Self-pay | Admitting: Gastroenterology

## 2018-01-16 ENCOUNTER — Ambulatory Visit
Admission: RE | Admit: 2018-01-16 | Discharge: 2018-01-16 | Disposition: A | Discharge status: Home / Self Care | Payer: Managed Care, Other (non HMO) | Source: Ambulatory Visit | Attending: Family Medicine | Admitting: Family Medicine

## 2018-01-16 DIAGNOSIS — N6001 Solitary cyst of right breast: Secondary | ICD-10-CM

## 2018-01-16 DIAGNOSIS — N611 Abscess of the breast and nipple: Secondary | ICD-10-CM

## 2018-02-11 ENCOUNTER — Ambulatory Visit (INDEPENDENT_AMBULATORY_CARE_PROVIDER_SITE_OTHER): Payer: Self-pay

## 2018-02-11 DIAGNOSIS — Z1211 Encounter for screening for malignant neoplasm of colon: Secondary | ICD-10-CM

## 2018-02-11 MED ORDER — NA SULFATE-K SULFATE-MG SULF 17.5-3.13-1.6 GM/177ML PO SOLN: 1.0000 | ORAL | 0 refills | Status: DC

## 2018-02-11 NOTE — Patient Instructions (Signed)
Shari Ortiz  [birth year] MRN: [id number]     Procedure Date: 03/26/18 Time to register: 9:45am Place to register: [facility] Procedure Time: 10:45am Scheduled provider: Barney Drain, MD    PREPARATION FOR COLONOSCOPY WITH SUPREP BOWEL PREP KIT  Note: Suprep Bowel Prep Kit is a split-dose (2day) regimen. Consumption of BOTH 6-ounce bottles is required for a complete prep.  Please notify us immediately if you are diabetic, take iron supplements, or if you are on Coumadin or any other blood thinners.  Please hold the following medications: I will mail you a letter with this information                                                                                                                                                    1 DAY BEFORE PROCEDURE:  DATE: 03/25/18  DAY: Tuesday clear liquids the entire day - NO SOLID FOOD.   Diabetic medications adjustments for today: see letter  At 6:00pm: Complete steps 1 through 4 below, using ONE (1) 6-ounce bottle, before going to bed. Step 1:  Pour ONE (1) 6-ounce bottle of SUPREP liquid into the mixing container.  Step 2:  Add cool drinking water to the 16 ounce line on the container and mix.  Note: Dilute the solution concentrate as directed prior to use. Step 3:  DRINK ALL the liquid in the container. Step 4:  You MUST drink an additional two (2) or more 16 ounce containers of water  over the next one (1) hour.   Continue clear liquids only EXCEPTION: If you take medications for your heart, blood pressure, or breathing, you may take these medications with a small amount of clear liquid.   DAY OF PROCEDURE:   DATE: 03/26/18   DAY: Wednesday  Diabetic medications adjustments for today:see letter  5 hours before your procedure at 5:45am: Step 1:  Pour ONE (1) 6-ounce bottle of SUPREP liquid into the mixing container.  Step 2:  Add cool drinking water to the 16 ounce line on the container and mix.  Note: Dilute the solution  concentrate as directed prior to use. Step 3:  DRINK ALL the liquid in the container. Step 4:  You MUST drink an additional two (2) or more 16 ounce containers of water  over the next one (1) hour. You MUST complete the final glass of water at least  3 hours before your colonoscopy.   Nothing by mouth past 7:45am  You may take your morning medications with sip of water unless we have instructed otherwise.    Please see below for Dietary Information.  CLEAR LIQUIDS INCLUDE:  Water Jello (NOT red in color)   Ice Popsicles (NOT red in color)   Tea (sugar ok, no milk/cream) Powdered fruit flavored drinks  Coffee (sugar ok, no milk/cream) Gatorade/ Lemonade/ Kool-Aid  (  NOT red in color)   Juice: apple, white grape, white cranberry Soft drinks  Clear bullion, consomme, broth (fat free beef/chicken/vegetable)  Carbonated beverages (any kind)  Strained chicken noodle soup Hard Candy   Remember: Clear liquids are liquids that will allow you to see your fingers on the other side of a clear glass. Be sure liquids are NOT red in color, and not cloudy, but CLEAR.  DO NOT EAT OR DRINK ANY OF THE FOLLOWING:  Dairy products of any kind   Cranberry juice Tomato juice / V8 juice   Grapefruit juice Orange juice     Red grape juice  Do not eat any solid foods, including such foods as: cereal, oatmeal, yogurt, fruits, vegetables, creamed soups, eggs, bread, crackers, pureed foods in a blender, etc.   HELPFUL HINTS FOR DRINKING PREP SOLUTION:   Make sure prep is extremely cold. Mix and refrigerate the the morning of the prep. You may also put in the freezer.   You may try mixing some Crystal Light or Country Time Lemonade if you prefer. Mix in small amounts; add more if necessary.  Try drinking through a straw  Rinse mouth with water or a mouthwash between glasses, to remove after-taste.  Try sipping on a cold beverage /ice/ popsicles between glasses of prep.  Place a piece of sugar-free hard  candy in mouth between glasses.  If you become nauseated, try consuming smaller amounts, or stretch out the time between glasses. Stop for 30-60 minutes, then slowly start back drinking.     OTHER INSTRUCTIONS  You will need a responsible adult at least 65 years of age to accompany you and drive you home. This person must remain in the waiting room during your procedure. The hospital will cancel your procedure if you do not have a responsible adult with you.   1. Wear loose fitting clothing that is easily removed. 2. Leave jewelry and other valuables at home.  3. Remove all body piercing jewelry and leave at home. 4. Total time from sign-in until discharge is approximately 2-3 hours. 5. You should go home directly after your procedure and rest. You can resume normal activities the day after your procedure. 6. The day of your procedure you should not:  Drive  Make legal decisions  Operate machinery  Drink alcohol  Return to work   You may call the office (Dept: [phone]) before 5:00pm, or page the doctor on call [phone]) after 5:00pm, for further instructions, if necessary.   Insurance Information YOU WILL NEED TO CHECK WITH YOUR INSURANCE COMPANY FOR THE BENEFITS OF COVERAGE YOU HAVE FOR THIS PROCEDURE.  UNFORTUNATELY, NOT ALL INSURANCE COMPANIES HAVE BENEFITS TO COVER ALL OR PART OF THESE TYPES OF PROCEDURES.  IT IS YOUR RESPONSIBILITY TO CHECK YOUR BENEFITS, HOWEVER, WE WILL BE GLAD TO ASSIST YOU WITH ANY CODES YOUR INSURANCE COMPANY MAY NEED.    PLEASE NOTE THAT MOST INSURANCE COMPANIES WILL NOT COVER A SCREENING COLONOSCOPY FOR PEOPLE UNDER THE AGE OF 50  IF YOU HAVE BCBS INSURANCE, YOU MAY HAVE BENEFITS FOR A SCREENING COLONOSCOPY BUT IF POLYPS ARE FOUND THE DIAGNOSIS WILL CHANGE AND THEN YOU MAY HAVE A DEDUCTIBLE THAT WILL NEED TO BE MET. SO PLEASE MAKE SURE YOU CHECK YOUR BENEFITS FOR A SCREENING COLONOSCOPY AS WELL AS A DIAGNOSTIC COLONOSCOPY.

## 2018-02-11 NOTE — Progress Notes (Signed)
Gastroenterology Pre-Procedure Review  Request Date:02/11/18 Requesting Physician: Dr.Bland (per pt- she had a tcs 10+ years ago at [facility], no polyps, she said the MD has since passed away, she cannot remember who it was)   PATIENT REVIEW QUESTIONS: The patient responded to the following health history questions as indicated:    1. Diabetes Melitis: yes (glyxambi and insulin) 2. Joint replacements in the past 12 months: no 3. Major health problems in the past 3 months: no 4. Has an artificial valve or MVP: no 5. Has a defibrillator: no 6. Has been advised in past to take antibiotics in advance of a procedure like teeth cleaning: no 7. Family history of colon cancer: no  8. Alcohol Use: no 9. History of sleep apnea: no  10. History of coronary artery or other vascular stents placed within the last 12 months: no 11. History of any prior anesthesia complications: no    MEDICATIONS & ALLERGIES:    Patient reports the following regarding taking any blood thinners:   Plavix? no Aspirin? no Coumadin? no Brilinta? no Xarelto? no Eliquis? no Pradaxa? no Savaysa? no Effient? no  Patient confirms/reports the following medications:  Current Outpatient Medications  Medication Sig Dispense Refill  . GLYXAMBI 10-5 MG TABS     . Insulin Glargine (BASAGLAR KWIKPEN) 100 UNIT/ML SOPN     . lisinopril-hydrochlorothiazide (PRINZIDE,ZESTORETIC) 20-25 MG tablet     . LIVALO 4 MG TABS     . methyldopa (ALDOMET) 250 MG tablet      No current facility-administered medications for this visit.     Patient confirms/reports the following allergies:  Allergies  Allergen Reactions  . Penicillin G Rash    No orders of the defined types were placed in this encounter.   AUTHORIZATION INFORMATION Primary Insurance: [place],  [place] #: [id number] Pre-Cert / Josem Kaufmann required: no  SCHEDULE INFORMATION: Procedure has been scheduled as follows:  Date: 03/26/18, Time: 10:45 Location: [facility]  This  Gastroenterology Pre-Precedure Review Form is being routed to the following provider(s): AB

## 2018-02-14 NOTE — Progress Notes (Signed)
No glyxambi morning of procedure. Unsure when she takes the insulin, but if in the evening, take 1/2 dose evening prior to colonoscopy. If morning, then no insuline.

## 2018-02-17 NOTE — Progress Notes (Signed)
Letter mailed to the pt with DM information.  

## 2018-03-26 ENCOUNTER — Encounter (HOSPITAL_COMMUNITY)
Admission: RE | Disposition: A | Discharge status: Home / Self Care | Payer: Self-pay | Source: Ambulatory Visit | Attending: Gastroenterology

## 2018-03-26 ENCOUNTER — Encounter (HOSPITAL_COMMUNITY): Payer: Self-pay

## 2018-03-26 ENCOUNTER — Ambulatory Visit (HOSPITAL_COMMUNITY)
Admission: RE | Admit: 2018-03-26 | Discharge: 2018-03-26 | Disposition: A | Discharge status: Home / Self Care | Payer: Managed Care, Other (non HMO) | Source: Ambulatory Visit | Attending: Gastroenterology | Admitting: Gastroenterology

## 2018-03-26 ENCOUNTER — Other Ambulatory Visit: Payer: Self-pay

## 2018-03-26 DIAGNOSIS — K648 Other hemorrhoids: Secondary | ICD-10-CM | POA: Insufficient documentation

## 2018-03-26 DIAGNOSIS — I10 Essential (primary) hypertension: Secondary | ICD-10-CM | POA: Diagnosis not present

## 2018-03-26 DIAGNOSIS — Z1212 Encounter for screening for malignant neoplasm of rectum: Secondary | ICD-10-CM | POA: Diagnosis not present

## 2018-03-26 DIAGNOSIS — Z794 Long term (current) use of insulin: Secondary | ICD-10-CM | POA: Diagnosis not present

## 2018-03-26 DIAGNOSIS — D122 Benign neoplasm of ascending colon: Secondary | ICD-10-CM | POA: Insufficient documentation

## 2018-03-26 DIAGNOSIS — E119 Type 2 diabetes mellitus without complications: Secondary | ICD-10-CM | POA: Insufficient documentation

## 2018-03-26 DIAGNOSIS — Z1211 Encounter for screening for malignant neoplasm of colon: Secondary | ICD-10-CM | POA: Diagnosis not present

## 2018-03-26 DIAGNOSIS — Z79899 Other long term (current) drug therapy: Secondary | ICD-10-CM | POA: Insufficient documentation

## 2018-03-26 DIAGNOSIS — Q438 Other specified congenital malformations of intestine: Secondary | ICD-10-CM | POA: Diagnosis not present

## 2018-03-26 DIAGNOSIS — Z880 Allergy status to penicillin: Secondary | ICD-10-CM | POA: Diagnosis not present

## 2018-03-26 HISTORY — PX: COLONOSCOPY: SHX5424

## 2018-03-26 HISTORY — PX: POLYPECTOMY: SHX5525

## 2018-03-26 LAB — GLUCOSE, CAPILLARY
Glucose-Capillary: 81 mg/dL (ref 70–99)
Glucose-Capillary: 180 mg/dL — ABNORMAL HIGH (ref 70–99)

## 2018-03-26 SURGERY — COLONOSCOPY
Anesthesia: Moderate Sedation

## 2018-03-26 MED ORDER — MEPERIDINE HCL 100 MG/ML IJ SOLN
INTRAMUSCULAR | Status: AC
  Filled 2018-03-26: qty 2

## 2018-03-26 MED ORDER — MIDAZOLAM HCL 5 MG/5ML IJ SOLN
INTRAMUSCULAR | Status: AC
  Filled 2018-03-26: qty 10

## 2018-03-26 MED ORDER — DEXTROSE-NACL 5-0.9 % IV SOLN
INTRAVENOUS | Status: DC
  Administered 2018-03-26: 10:00:00 via INTRAVENOUS

## 2018-03-26 MED ORDER — MIDAZOLAM HCL 5 MG/5ML IJ SOLN
INTRAMUSCULAR | Status: DC | PRN
  Administered 2018-03-26 (×2): 2 mg via INTRAVENOUS

## 2018-03-26 MED ORDER — MEPERIDINE HCL 100 MG/ML IJ SOLN
INTRAMUSCULAR | Status: DC | PRN
  Administered 2018-03-26 (×2): 25 mg via INTRAVENOUS

## 2018-03-26 MED ORDER — SODIUM CHLORIDE 0.9 % IV SOLN
INTRAVENOUS | Status: DC
  Administered 2018-03-26: 10:00:00 via INTRAVENOUS

## 2018-03-26 NOTE — Op Note (Signed)
[facility] Patient Name: Shari Ortiz Procedure Date: 03/26/2018 10:56 AM MRN: [id number] Date of Birth: 10-28-1950 Attending MD: Barney Drain MD, MD CSN: [id number] Age: 67 Admit Type: Outpatient Procedure:                Colonoscopy with SNARE CAUTERY POLYPECTOMY Indications:              Screening for colorectal malignant neoplasm Providers:                Barney Drain MD, MD, Lurline Del, RN, Randa Spike, Technician Referring MD:             Elyn Peers MD Medicines:                Meperidine 75 mg IV, Midazolam 5 mg IV Complications:            No immediate complications. Estimated Blood Loss:     Estimated blood loss: none. Procedure:                Pre-Anesthesia Assessment:                           - Prior to the procedure, a History and Physical                            was performed, and patient medications and                            allergies were reviewed. The patient's tolerance of                            previous anesthesia was also reviewed. The risks                            and benefits of the procedure and the sedation                            options and risks were discussed with the patient.                            All questions were answered, and informed consent                            was obtained. Prior Anticoagulants: The patient has                            taken no previous anticoagulant or antiplatelet                            agents. ASA Grade Assessment: II - A patient with                            mild systemic disease. After reviewing the risks  and benefits, the patient was deemed in                            satisfactory condition to undergo the procedure.                            After obtaining informed consent, the colonoscope                            was passed under direct vision. Throughout the                            procedure, the patient's blood  pressure, pulse, and                            oxygen saturations were monitored continuously. The                            CF-[id number] ([id number]) scope was introduced through                            the anus and advanced to the the cecum, identified                            by appendiceal orifice and ileocecal valve. The                            colonoscopy was somewhat difficult due to a                            tortuous colon. Successful completion of the                            procedure was aided by straightening and shortening                            the scope to obtain bowel loop reduction and                            COLOWRAP. The patient tolerated the procedure well.                            The quality of the bowel preparation was excellent.                            The ileocecal valve, appendiceal orifice, and                            rectum were photographed. Scope In: 11:26:41 AM Scope Out: 11:43:06 AM Scope Withdrawal Time: 0 hours 12 minutes 30 seconds  Total Procedure Duration: 0 hours 16 minutes 25 seconds  Findings:      A 8 mm polyp was found in the proximal ascending colon. The polyp was  sessile. The polyp was removed with a hot snare. Resection and retrieval       were complete.      The recto-sigmoid colon, sigmoid colon and descending colon were       moderately redundant.      Internal hemorrhoids were found. The hemorrhoids were moderate. Impression:               - One 8 mm polyp in the proximal ascending colon,                            removed with a hot snare. Resected and retrieved.                           - Redundant colon.                           - Internal hemorrhoids. Moderate Sedation:      Moderate (conscious) sedation was administered by the endoscopy nurse       and supervised by the endoscopist. The following parameters were       monitored: oxygen saturation, heart rate, blood pressure, and response       to  care. Total physician intraservice time was 30 minutes. Recommendation:           - Patient has a contact number available for                            emergencies. The signs and symptoms of potential                            delayed complications were discussed with the                            patient. Return to normal activities tomorrow.                            Written discharge instructions were provided to the                            patient.                           - High fiber diet.                           - Continue present medications.                           - Await pathology results.                           - Repeat colonoscopy in 5-10 years for surveillance. Procedure Code(s):        --- Professional ---                           [phone], Colonoscopy, flexible; with removal of  tumor(s), polyp(s), or other lesion(s) by snare                            technique                           G0500, Moderate sedation services provided by the                            same physician or other qualified health care                            professional performing a gastrointestinal                            endoscopic service that sedation supports,                            requiring the presence of an independent trained                            observer to assist in the monitoring of the                            patient's level of consciousness and physiological                            status; initial 15 minutes of intra-service time;                            patient age 53 years or older (additional time may                            be reported with [id number], as appropriate)                           [phone], Moderate sedation services provided by the                            same physician or other qualified health care                            professional performing the diagnostic or                             therapeutic service that the sedation supports,                            requiring the presence of an independent trained                            observer to assist in the monitoring of the                            patient's  level of consciousness and physiological                            status; each additional 15 minutes intraservice                            time (List separately in addition to code for                            primary service) Diagnosis Code(s):        --- Professional ---                           Z12.11, Encounter for screening for malignant                            neoplasm of colon                           D12.2, Benign neoplasm of ascending colon                           K64.8, Other hemorrhoids                           Q43.8, Other specified congenital malformations of                            intestine CPT copyright 2017 [employer]. All rights reserved. The codes documented in this report are preliminary and upon coder review may  be revised to meet current compliance requirements. Barney Drain, MD Barney Drain MD, MD 03/26/2018 11:57:54 AM This report has been signed electronically. Number of Addenda: 0

## 2018-03-26 NOTE — Discharge Instructions (Signed)
You had 1polyp removed. You have  SMALL internal hemorrhoids.   DRINK WATER TO KEEP YOUR URINE LIGHT YELLOW.  FOLLOW A HIGH FIBER DIET. AVOID ITEMS THAT CAUSE BLOATING & GAS. SEE INFO BELOW.   YOUR BIOPSY RESULTS WILL BE AVAILABLE IN 7 days.  Next colonoscopy in 5-10 years.    Colonoscopy Care After Read the instructions outlined below and refer to this sheet in the next week. These discharge instructions provide you with general information on caring for yourself after you leave the hospital. While your treatment has been planned according to the most current medical practices available, unavoidable complications occasionally occur. If you have any problems or questions after discharge, call DR. Katelyn Kohlmeyer, [phone].  ACTIVITY  You may resume your regular activity, but move at a slower pace for the next 24 hours.   Take frequent rest periods for the next 24 hours.   Walking will help get rid of the air and reduce the bloated feeling in your belly (abdomen).   No driving for 24 hours (because of the medicine (anesthesia) used during the test).   You may shower.   Do not sign any important legal documents or operate any machinery for 24 hours (because of the anesthesia used during the test).    NUTRITION  Drink plenty of fluids.   You may resume your normal diet as instructed by your doctor.   Begin with a light meal and progress to your normal diet. Heavy or fried foods are harder to digest and may make you feel sick to your stomach (nauseated).   Avoid alcoholic beverages for 24 hours or as instructed.    MEDICATIONS  You may resume your normal medications.   WHAT YOU CAN EXPECT TODAY  Some feelings of bloating in the abdomen.   Passage of more gas than usual.   Spotting of blood in your stool or on the toilet paper  .  IF YOU HAD POLYPS REMOVED DURING THE COLONOSCOPY:  Eat a soft diet IF YOU HAVE NAUSEA, BLOATING, ABDOMINAL PAIN, OR VOMITING.    FINDING  OUT THE RESULTS OF YOUR TEST Not all test results are available during your visit. DR. Oneida Alar WILL CALL YOU WITHIN 14 DAYS OF YOUR PROCEDUE WITH YOUR RESULTS. Do not assume everything is normal if you have not heard from DR. Legacy Lacivita, CALL HER OFFICE AT [phone].  SEEK IMMEDIATE MEDICAL ATTENTION AND CALL THE OFFICE: [phone] IF:  You have more than a spotting of blood in your stool.   Your belly is swollen (abdominal distention).   You are nauseated or vomiting.   You have a temperature over 101F.   You have abdominal pain or discomfort that is severe or gets worse throughout the day.   High-Fiber Diet A high-fiber diet changes your normal diet to include more whole grains, legumes, fruits, and vegetables. Changes in the diet involve replacing refined carbohydrates with unrefined foods. The calorie level of the diet is essentially unchanged. The Dietary Reference Intake (recommended amount) for adult males is 38 grams per day. For adult females, it is 25 grams per day. Pregnant and lactating women should consume 28 grams of fiber per day. Fiber is the intact part of a plant that is not broken down during digestion. Functional fiber is fiber that has been isolated from the plant to provide a beneficial effect in the body. PURPOSE  Increase stool bulk.   Ease and regulate bowel movements.   Lower cholesterol.   REDUCE RISK OF COLON CANCER  INDICATIONS THAT YOU NEED MORE FIBER  Constipation and hemorrhoids.   Uncomplicated diverticulosis (intestine condition) and irritable bowel syndrome.   Weight management.   As a protective measure against hardening of the arteries (atherosclerosis), diabetes, and cancer.   GUIDELINES FOR INCREASING FIBER IN THE DIET  Start adding fiber to the diet slowly. A gradual increase of about 5 more grams (2 slices of whole-wheat bread, 2 servings of most fruits or vegetables, or 1 bowl of high-fiber cereal) per day is best. Too rapid an  increase in fiber may result in constipation, flatulence, and bloating.   Drink enough water and fluids to keep your urine clear or pale yellow. Water, juice, or caffeine-free drinks are recommended. Not drinking enough fluid may cause constipation.   Eat a variety of high-fiber foods rather than one type of fiber.   Try to increase your intake of fiber through using high-fiber foods rather than fiber pills or supplements that contain small amounts of fiber.   The goal is to change the types of food eaten. Do not supplement your present diet with high-fiber foods, but replace foods in your present diet.   INCLUDE A VARIETY OF FIBER SOURCES  Replace refined and processed grains with whole grains, canned fruits with fresh fruits, and incorporate other fiber sources. White rice, white breads, and most bakery goods contain little or no fiber.   Brown whole-grain rice, buckwheat oats, and many fruits and vegetables are all good sources of fiber. These include: broccoli, Brussels sprouts, cabbage, cauliflower, beets, sweet potatoes, white potatoes (skin on), carrots, tomatoes, eggplant, squash, berries, fresh fruits, and dried fruits.   Cereals appear to be the richest source of fiber. Cereal fiber is found in whole grains and bran. Bran is the fiber-rich outer coat of cereal grain, which is largely removed in refining. In whole-grain cereals, the bran remains. In breakfast cereals, the largest amount of fiber is found in those with "bran" in their names. The fiber content is sometimes indicated on the label.   You may need to include additional fruits and vegetables each day.   In baking, for 1 cup white flour, you may use the following substitutions:   1 cup whole-wheat flour minus 2 tablespoons.   1/2 cup white flour plus 1/2 cup whole-wheat flour.   Polyps, Colon  A polyp is extra tissue that grows inside your body. Colon polyps grow in the large intestine. The large intestine, also called  the colon, is part of your digestive system. It is a long, hollow tube at the end of your digestive tract where your body makes and stores stool. Most polyps are not dangerous. They are benign. This means they are not cancerous. But over time, some types of polyps can turn into cancer. Polyps that are smaller than a pea are usually not harmful. But larger polyps could someday become or may already be cancerous. To be safe, doctors remove all polyps and test them.   WHO GETS POLYPS? Anyone can get polyps, but certain people are more likely than others. You may have a greater chance of getting polyps if:  You are over 50.   You have had polyps before.   Someone in your family has had polyps.   Someone in your family has had cancer of the large intestine.   Find out if someone in your family has had polyps. You may also be more likely to get polyps if you:   Eat a lot of fatty foods  Smoke   Drink alcohol   Do not exercise  Eat too much   PREVENTION There is not one sure way to prevent polyps. You might be able to lower your risk of getting them if you:  Eat more fruits and vegetables and less fatty food.   Do not smoke.   Avoid alcohol.   Exercise every day.   Lose weight if you are overweight.   Eating more calcium and folate can also lower your risk of getting polyps. Some foods that are rich in calcium are milk, cheese, and broccoli. Some foods that are rich in folate are chickpeas, kidney beans, and spinach.   Hemorrhoids Hemorrhoids are dilated (enlarged) veins around the rectum. Sometimes clots will form in the veins. This makes them swollen and painful. These are called thrombosed hemorrhoids. Causes of hemorrhoids include:  Constipation.   Straining to have a bowel movement.   HEAVY LIFTING  HOME CARE INSTRUCTIONS  Eat a well balanced diet and drink 6 to 8 glasses of water every day to avoid constipation. You may also use a bulk laxative.   Avoid straining to  have bowel movements.   Keep anal area dry and clean.   Do not use a donut shaped pillow or sit on the toilet for long periods. This increases blood pooling and pain.   Move your bowels when your body has the urge; this will require less straining and will decrease pain and pressure.

## 2018-03-26 NOTE — H&P (Signed)
Primary Care Physician:  Lucianne Lei, MD Primary Gastroenterologist:  Dr. Oneida Alar  Pre-Procedure History & Physical: HPI:  Shari Ortiz is a 67 y.o. female here for [place].  Past Medical History:  Diagnosis Date  . Diabetes mellitus without complication ([place])   . Hypertension     Past Surgical History:  Procedure Laterality Date  . ABDOMINAL HYSTERECTOMY    . TONSILLECTOMY      Prior to Admission medications   Medication Sig Start Date End Date Taking? Authorizing Provider  GLYXAMBI 10-5 MG TABS Take 1 tablet by mouth daily.  12/28/16  Yes [provider]  Insulin Glargine (BASAGLAR KWIKPEN) 100 UNIT/ML SOPN Inject 22 Units into the skin every evening.  12/28/16  Yes [provider]  lisinopril-hydrochlorothiazide (PRINZIDE,ZESTORETIC) 20-25 MG tablet Take 1 tablet by mouth daily.  01/16/17  Yes [provider]  LIVALO 4 MG TABS Take 1 tablet by mouth daily.  11/22/16  Yes [provider]  methyldopa (ALDOMET) 250 MG tablet Take 250 mg by mouth 2 (two) times daily.  01/16/17  Yes [provider]    Allergies as of 02/11/2018 - Review Complete 02/11/2018  Allergen Reaction Noted  . Penicillin g Rash 07/24/2011    Family History  Problem Relation Age of Onset  . Colon cancer Neg Hx   . Colon polyps Neg Hx     Social History   Socioeconomic History  . Marital status: Widowed    Spouse name: Not on file  . Number of children: Not on file  . Years of education: Not on file  . Highest education level: Not on file  Occupational History  . Not on file  Social Needs  . Financial resource strain: Not on file  . Food insecurity:    Worry: Not on file    Inability: Not on file  . Transportation needs:    Medical: Not on file    Non-medical: Not on file  Tobacco Use  . Smoking status: Never Smoker  . Smokeless tobacco: Never Used  Substance and Sexual Activity  . Alcohol use: No  . Drug use: No  . Sexual  activity: Not on file  Lifestyle  . Physical activity:    Days per week: Not on file    Minutes per session: Not on file  . Stress: Not on file  Relationships  . Social connections:    Talks on phone: Not on file    Gets together: Not on file    Attends religious service: Not on file    Active member of club or organization: Not on file    Attends meetings of clubs or organizations: Not on file    Relationship status: Not on file  . Intimate partner violence:    Fear of current or ex partner: Not on file    Emotionally abused: Not on file    Physically abused: Not on file    Forced sexual activity: Not on file  Other Topics Concern  . Not on file  Social History Narrative  . Not on file    Review of Systems: See HPI, otherwise negative ROS   Physical Exam: BP 103/71   Pulse 75   Temp 97.8 F (36.6 C) (Oral)   Resp (!) 22   Ht 5\' 4"  (1.626 m)   Wt 150 lb (68 kg)   SpO2 100%   BMI 25.75 kg/m  General:   Alert,  pleasant and cooperative in NAD Head:  Normocephalic  and atraumatic. Neck:  Supple; Lungs:  Clear throughout to auscultation.    Heart:  Regular rate and rhythm. Abdomen:  Soft, nontender and nondistended. Normal bowel sounds, without guarding, and without rebound.   Neurologic:  Alert and  oriented x4;  grossly normal neurologically.  Impression/Plan:    SCREENING  Plan:  1. TCS TODAY DISCUSSED PROCEDURE, BENEFITS, & RISKS: < 1% chance of medication reaction, bleeding, perforation, or rupture of spleen/liver.

## 2018-03-28 ENCOUNTER — Telehealth: Payer: Self-pay | Admitting: Gastroenterology

## 2018-03-28 NOTE — Telephone Encounter (Signed)
PLEASE CALL PT. ONE SIMPLE ADENOMA REMOVED. NEXT COLONOSCOPY BETWEEN 2024-2026.

## 2018-03-28 NOTE — Telephone Encounter (Signed)
LM for a return call.  

## 2018-03-31 ENCOUNTER — Encounter (HOSPITAL_COMMUNITY): Payer: Self-pay | Admitting: Gastroenterology

## 2018-04-02 NOTE — Telephone Encounter (Signed)
Pts sister notified of results and will let the pt know.

## 2018-04-07 NOTE — Telephone Encounter (Signed)
Reminder in epic °

## 2018-07-21 ENCOUNTER — Ambulatory Visit
Admission: RE | Admit: 2018-07-21 | Discharge: 2018-07-21 | Disposition: A | Discharge status: Home / Self Care | Payer: Managed Care, Other (non HMO) | Source: Ambulatory Visit | Attending: Family Medicine | Admitting: Family Medicine

## 2018-07-21 ENCOUNTER — Other Ambulatory Visit: Payer: Managed Care, Other (non HMO)

## 2018-07-21 DIAGNOSIS — N6001 Solitary cyst of right breast: Secondary | ICD-10-CM

## 2018-08-26 DIAGNOSIS — E119 Type 2 diabetes mellitus without complications: Secondary | ICD-10-CM | POA: Insufficient documentation

## 2018-08-26 DIAGNOSIS — Z79899 Other long term (current) drug therapy: Secondary | ICD-10-CM | POA: Diagnosis not present

## 2018-08-26 DIAGNOSIS — R42 Dizziness and giddiness: Secondary | ICD-10-CM | POA: Diagnosis present

## 2018-08-26 DIAGNOSIS — I10 Essential (primary) hypertension: Secondary | ICD-10-CM | POA: Diagnosis not present

## 2018-08-26 DIAGNOSIS — I951 Orthostatic hypotension: Secondary | ICD-10-CM | POA: Diagnosis not present

## 2018-08-26 DIAGNOSIS — E876 Hypokalemia: Secondary | ICD-10-CM | POA: Diagnosis not present

## 2018-08-27 ENCOUNTER — Emergency Department (HOSPITAL_COMMUNITY): Payer: Managed Care, Other (non HMO)

## 2018-08-27 ENCOUNTER — Emergency Department (HOSPITAL_COMMUNITY)
Admission: EM | Admit: 2018-08-27 | Discharge: 2018-08-27 | Disposition: A | Discharge status: Home / Self Care | Payer: Managed Care, Other (non HMO) | Attending: Emergency Medicine | Admitting: Emergency Medicine

## 2018-08-27 ENCOUNTER — Other Ambulatory Visit: Payer: Self-pay

## 2018-08-27 DIAGNOSIS — E876 Hypokalemia: Secondary | ICD-10-CM

## 2018-08-27 DIAGNOSIS — I951 Orthostatic hypotension: Secondary | ICD-10-CM

## 2018-08-27 LAB — URINALYSIS, ROUTINE W REFLEX MICROSCOPIC
Bilirubin Urine: NEGATIVE
Glucose, UA: >=500 mg/dL — AB
Ketones, ur: NEGATIVE mg/dL
Leukocytes, UA: NEGATIVE
Nitrite: NEGATIVE
Protein, ur: NEGATIVE mg/dL
Specific Gravity, Urine: 1.03 (ref 1.005–1.030)
pH: 5 (ref 5.0–8.0)

## 2018-08-27 LAB — DIFFERENTIAL
Abs Immature Granulocytes: 0.04 10*3/uL (ref 0.00–0.07)
Basophils Absolute: 0.1 10*3/uL (ref 0.0–0.1)
Basophils Relative: 0 %
Eosinophils Absolute: 0.1 10*3/uL (ref 0.0–0.5)
Eosinophils Relative: 1 %
Immature Granulocytes: 0 %
Lymphocytes Relative: 31 %
Lymphs Abs: 4 10*3/uL (ref 0.7–4.0)
Monocytes Absolute: 0.6 10*3/uL (ref 0.1–1.0)
Monocytes Relative: 5 %
Neutro Abs: 7.9 10*3/uL — ABNORMAL HIGH (ref 1.7–7.7)
Neutrophils Relative %: 63 %

## 2018-08-27 LAB — COMPREHENSIVE METABOLIC PANEL
ALT: 22 U/L (ref 0–44)
AST: 23 U/L (ref 15–41)
Albumin: 4 g/dL (ref 3.5–5.0)
Alkaline Phosphatase: 49 U/L (ref 38–126)
Anion gap: 10 (ref 5–15)
BUN: 14 mg/dL (ref 8–23)
CO2: 28 mmol/L (ref 22–32)
Calcium: 9.6 mg/dL (ref 8.9–10.3)
Chloride: 102 mmol/L (ref 98–111)
Creatinine, Ser: 0.86 mg/dL (ref 0.44–1.00)
GFR calc Af Amer: >60 mL/min (ref >60)
GFR calc non Af Amer: >60 mL/min (ref >60)
Glucose, Bld: 183 mg/dL — ABNORMAL HIGH (ref 70–99)
Potassium: 3.1 mmol/L — ABNORMAL LOW (ref 3.5–5.1)
Sodium: 140 mmol/L (ref 135–145)
Total Bilirubin: 0.3 mg/dL (ref 0.3–1.2)
Total Protein: 6.8 g/dL (ref 6.5–8.1)

## 2018-08-27 LAB — CBC
HCT: 40.9 % (ref 36.0–46.0)
Hemoglobin: 12.7 g/dL (ref 12.0–15.0)
MCH: 27.4 pg (ref 26.0–34.0)
MCHC: 31.1 g/dL (ref 30.0–36.0)
MCV: 88.1 fL (ref 80.0–100.0)
Platelets: 228 10*3/uL (ref 150–400)
RBC: 4.64 MIL/uL (ref 3.87–5.11)
RDW: 14.3 % (ref 11.5–15.5)
WBC: 12.7 10*3/uL — ABNORMAL HIGH (ref 4.0–10.5)
nRBC: 0 % (ref 0.0–0.2)

## 2018-08-27 LAB — I-STAT TROPONIN, ED
Troponin i, poc: 0 ng/mL (ref 0.00–0.08)
Troponin i, poc: 0 ng/mL (ref 0.00–0.08)

## 2018-08-27 MED ORDER — POTASSIUM CHLORIDE CRYS ER 20 MEQ PO TBCR: 20.0000 meq | EXTENDED_RELEASE_TABLET | Freq: Two times a day (BID) | ORAL | 0 refills | Status: AC

## 2018-08-27 MED ORDER — SODIUM CHLORIDE 0.9 % IV BOLUS: 1000.0000 mL | Freq: Once | INTRAVENOUS | Status: DC

## 2018-08-27 MED ORDER — LACTATED RINGERS IV BOLUS
1000.0000 mL | Freq: Once | INTRAVENOUS | Status: AC
  Administered 2018-08-27: 1000 mL via INTRAVENOUS

## 2018-08-27 MED ORDER — KETOROLAC TROMETHAMINE 15 MG/ML IJ SOLN
15.0000 mg | Freq: Once | INTRAMUSCULAR | Status: AC
  Administered 2018-08-27: 15 mg via INTRAVENOUS
  Filled 2018-08-27: qty 1

## 2018-08-27 MED ORDER — POTASSIUM CHLORIDE CRYS ER 20 MEQ PO TBCR
40.0000 meq | EXTENDED_RELEASE_TABLET | Freq: Once | ORAL | Status: AC
  Administered 2018-08-27: 40 meq via ORAL
  Filled 2018-08-27: qty 2

## 2018-08-27 MED ORDER — ACETAMINOPHEN 500 MG PO TABS
1000.0000 mg | ORAL_TABLET | Freq: Once | ORAL | Status: AC
  Administered 2018-08-27: 1000 mg via ORAL
  Filled 2018-08-27: qty 2

## 2018-08-27 NOTE — ED Triage Notes (Signed)
Patient c/o "moving to quickly" and falling; states she did not lose consciousness but friend states she was just "staring".

## 2018-08-27 NOTE — ED Provider Notes (Signed)
[facility] Provider Note   CSN: [id number] Arrival date & time: 08/26/18  2345     History   Chief Complaint Chief Complaint  Patient presents with  . Dizziness    HPI Shari Ortiz is a 68 y.o. female.  HPI   68 yo F with PMHx HTN, DM, here with syncopal episode. Pt was at church yesterday evening when at around 10:30 PM, she passed out. She had been at church for approx 1 hour, had just taken her BP meds and had little to eat/drink. She bent down to use a water fountain, stood up then abruptly lost consciousness. She's unable to recall if she had any preceding sx since it happened so quickly. No CP, palpitations since event. No focal numbness or weakness. Pt reportedly was immediately conscious after her fall, though slightly dazed. No recent med changes. She currently c/o mild R paraspinal TTP, R shoulder TTP. Pain is aching, throbbing worse w/ movement and palpation. Has not tried anything for it. No blood thinner use.  Past Medical History:  Diagnosis Date  . Diabetes mellitus without complication ([place])   . Hypertension     Patient Active Problem List   Diagnosis Date Noted  . Special screening for malignant neoplasms, colon   . Carpal tunnel syndrome, right upper limb 12/30/2017  . Ganglion cyst of volar aspect of right wrist 04/29/2017  . Arthritis of carpometacarpal [facility]) joint of right thumb 04/29/2017  . De Quervain's tenosynovitis, right 02/07/2017    Past Surgical History:  Procedure Laterality Date  . ABDOMINAL HYSTERECTOMY    . COLONOSCOPY N/A 03/26/2018   Procedure: COLONOSCOPY;  Surgeon: Danie Binder, MD;  Location: [facility];  Service: Endoscopy;  Laterality: N/A;  10:45  . POLYPECTOMY  03/26/2018   Procedure: POLYPECTOMY;  Surgeon: Danie Binder, MD;  Location: [facility];  Service: Endoscopy;;  ascending  . TONSILLECTOMY       OB History   No obstetric history on file.      Home Medications     Prior to Admission medications   Medication Sig Start Date End Date Taking? Authorizing Provider  GLYXAMBI 10-5 MG TABS Take 1 tablet by mouth daily.  12/28/16   [provider]  Insulin Glargine (BASAGLAR KWIKPEN) 100 UNIT/ML SOPN Inject 22 Units into the skin every evening.  12/28/16   [provider]  lisinopril-hydrochlorothiazide (PRINZIDE,ZESTORETIC) 20-25 MG tablet Take 1 tablet by mouth daily.  01/16/17   [provider]  LIVALO 4 MG TABS Take 1 tablet by mouth daily.  11/22/16   [provider]  methyldopa (ALDOMET) 250 MG tablet Take 250 mg by mouth 2 (two) times daily.  01/16/17   [provider]  potassium chloride SA (K-DUR,KLOR-CON) 20 MEQ tablet Take 1 tablet (20 mEq total) by mouth 2 (two) times daily for 2 days. 08/27/18 08/29/18  Duffy Bruce, MD    Family History Family History  Problem Relation Age of Onset  . Colon cancer Neg Hx   . Colon polyps Neg Hx     Social History Social History   Tobacco Use  . Smoking status: Never Smoker  . Smokeless tobacco: Never Used  Substance Use Topics  . Alcohol use: No  . Drug use: No     Allergies   Penicillin g   Review of Systems Review of Systems  Constitutional: Positive for fatigue. Negative for chills and fever.  HENT: Negative for congestion, rhinorrhea and sore  throat.   Eyes: Negative for visual disturbance.  Respiratory: Negative for cough, shortness of breath and wheezing.   Cardiovascular: Negative for chest pain and leg swelling.  Gastrointestinal: Negative for abdominal pain, diarrhea, nausea and vomiting.  Genitourinary: Negative for dysuria, flank pain, vaginal bleeding and vaginal discharge.  Musculoskeletal: Positive for arthralgias, myalgias and neck pain.  Skin: Negative for rash.  Allergic/Immunologic: Negative for immunocompromised state.  Neurological: Positive for syncope. Negative for headaches.  Hematological: Does not bruise/bleed easily.  All  other systems reviewed and are negative.    Physical Exam Updated Vital Signs BP (!) 162/82   Pulse 72   Temp 98.2 F (36.8 C) (Oral)   Resp 14   Ht 5\' 4"  (1.626 m)   Wt 66.7 kg   SpO2 100%   BMI 25.23 kg/m   Physical Exam Vitals signs and nursing note reviewed.  Constitutional:      General: She is not in acute distress.    Appearance: She is well-developed.  HENT:     Head: Normocephalic and atraumatic.     Mouth/Throat:     Mouth: Mucous membranes are dry.  Eyes:     Conjunctiva/sclera: Conjunctivae normal.  Neck:     Musculoskeletal: Neck supple.  Cardiovascular:     Rate and Rhythm: Normal rate and regular rhythm.     Heart sounds: Normal heart sounds. No murmur. No friction rub.  Pulmonary:     Effort: Pulmonary effort is normal. No respiratory distress.     Breath sounds: Normal breath sounds. No wheezing or rales.  Abdominal:     General: There is no distension.     Palpations: Abdomen is soft.     Tenderness: There is no abdominal tenderness.  Musculoskeletal:     Comments: TTP over R paracervical musculature and upper trapezius; no midline TTP; no bony TTP or deformity; R wrist soft splint in place  Skin:    General: Skin is warm.     Capillary Refill: Capillary refill takes less than 2 seconds.  Neurological:     Mental Status: She is alert and oriented to person, place, and time.     Motor: No abnormal muscle tone.     Neurological Exam:  Mental Status: Alert and oriented to person, place, and time. Attention and concentration normal. Speech clear. Recent memory is intact. Cranial Nerves: Visual fields grossly intact. EOMI and PERRLA. No nystagmus noted. Facial sensation intact at forehead, maxillary cheek, and chin/mandible bilaterally. No facial asymmetry or weakness. Hearing grossly normal. Uvula is midline, and palate elevates symmetrically. Normal SCM and trapezius strength. Tongue midline without fasciculations. Motor: Muscle strength 5/5 in  proximal and distal UE and LE bilaterally. No pronator drift. Muscle tone normal. Reflexes: 2+ and symmetrical in all four extremities.  Sensation: Intact to light touch in upper and lower extremities distally bilaterally.  Gait: Normal without ataxia. Coordination: Normal FTN bilaterally.      ED Treatments / Results  Labs (all labs ordered are listed, but only abnormal results are displayed) Labs Reviewed  CBC - Abnormal; Notable for the following components:      Result Value   WBC 12.7 (*)    All other components within normal limits  DIFFERENTIAL - Abnormal; Notable for the following components:   Neutro Abs 7.9 (*)    All other components within normal limits  COMPREHENSIVE METABOLIC PANEL - Abnormal; Notable for the following components:   Potassium 3.1 (*)    Glucose, Bld 183 (*)  All other components within normal limits  URINALYSIS, ROUTINE W REFLEX MICROSCOPIC - Abnormal; Notable for the following components:   APPearance HAZY (*)    Glucose, UA >=500 (*)    Hgb urine dipstick MODERATE (*)    Bacteria, UA FEW (*)    All other components within normal limits  I-STAT TROPONIN, ED  I-STAT TROPONIN, ED    EKG EKG Interpretation  Date/Time:  Wednesday August 27 2018 08:12:31 EST Ventricular Rate:  81 PR Interval:    QRS Duration: 81 QT Interval:  391 QTC Calculation: 454 R Axis:   93 Text Interpretation:  Sinus rhythm Ventricular premature complex Right axis deviation Minimal ST depression, inferior leads No significant change since last tracing Confirmed by Duffy Bruce [phone]) on 08/27/2018 8:17:28 AM   Radiology Dg Shoulder Right  Result Date: 08/27/2018 CLINICAL DATA:  Fall today with right shoulder pain. EXAM: RIGHT SHOULDER - 2+ VIEW COMPARISON:  None. FINDINGS: No dislocation at the right glenohumeral joint. No evidence of right acromioclavicular separation. No fracture. No suspicious focal osseous lesions. Mild right acromioclavicular joint  osteoarthritis. Small right subacromial spur. IMPRESSION: No fracture or malalignment in the right shoulder. Mild right AC joint osteoarthritis. Small right subacromial spur. Electronically Signed   By: Ilona Sorrel M.D.   On: 08/27/2018 01:33   Ct Head Wo Contrast  Result Date: 08/27/2018 CLINICAL DATA:  Fall. EXAM: CT HEAD WITHOUT CONTRAST TECHNIQUE: Contiguous axial images were obtained from the base of the skull through the vertex without intravenous contrast. COMPARISON:  None. FINDINGS: Brain: No evidence of parenchymal hemorrhage or extra-axial fluid collection. No mass lesion, mass effect, or midline shift. No CT evidence of acute infarction. Cerebral volume is age appropriate. No ventriculomegaly. Vascular: No acute abnormality. Skull: No evidence of calvarial fracture. Sinuses/Orbits: The visualized paranasal sinuses are essentially clear. Other:  The mastoid air cells are unopacified. IMPRESSION: Negative head CT. No evidence of acute intracranial abnormality. No evidence of calvarial fracture. Electronically Signed   By: Ilona Sorrel M.D.   On: 08/27/2018 01:39   Dg Humerus Right  Result Date: 08/27/2018 CLINICAL DATA:  Fall today with right shoulder and upper extremity pain EXAM: RIGHT HUMERUS - 2+ VIEW COMPARISON:  None. FINDINGS: There is no evidence of fracture or other focal bone lesions. Soft tissues are unremarkable. IMPRESSION: No fracture. Electronically Signed   By: Ilona Sorrel M.D.   On: 08/27/2018 01:31    Procedures Procedures (including critical care time)  Medications Ordered in ED Medications  potassium chloride SA (K-DUR,KLOR-CON) CR tablet 40 mEq (40 mEq Oral Given 08/27/18 0824)  lactated ringers bolus 1,000 mL (1,000 mLs Intravenous New Bag/Given 08/27/18 0844)  acetaminophen (TYLENOL) tablet 1,000 mg (1,000 mg Oral Given 08/27/18 0823)  ketorolac (TORADOL) 15 MG/ML injection 15 mg (15 mg Intravenous Given 08/27/18 1191)     Initial Impression / Assessment and Plan / ED  Course  I have reviewed the triage vital signs and the nursing notes.  Pertinent labs & imaging results that were available during my care of the patient were reviewed by me and considered in my medical decision making (see chart for details).  Clinical Course as of Aug 27 1020  Wed Aug 27, 2018  0859 68 yo F here w/ transient syncope in setting of position change, now resolved. No other sx. Neuro exam is non-focal. EKG with non-specific changes that are stable and delta trop negative. Of note, pt is on methyldopa and this occurred after position changes shortly after  taking her meds - likely orthostatic and she has nearly 20 point drop in SBP here. No signs of arrhythmia on tele. No signs of CVA. There as no seizure like activity. IVF given, will check Spine imaging and re-assess. CT imaging, plain films o/w reviewed and are negative.   [CI]  [place] neg. Pt has had no ectopy or arrhythmia now >8 hours since event. No murmurs or signs of CHF or valvular disorder. Had a discussion with pt, family. Given reassuring labs, history c/w orthostasis likely compounded by poor PO intake and methyldopa use, feel it's reasonable to d/c with encouraged fluids and discussion w/ PCP tomorrow.   [CI]    Clinical Course User Index [CI] Duffy Bruce, MD      Final Clinical Impressions(s) / ED Diagnoses   Final diagnoses:  Hypokalemia  Orthostasis    ED Discharge Orders         Ordered    potassium chloride SA (K-DUR,KLOR-CON) 20 MEQ tablet  2 times daily     08/27/18 1019           Duffy Bruce, MD 08/27/18 1022

## 2018-08-27 NOTE — Discharge Instructions (Signed)
Drink at least 8 glasses of water per day for the next few days  Be VERY careful when sitting upright or going from sitting to standing  Take Tylenol 500 to 1000 mg or Ibuprofen 400 mg for pain  Call your PCP today or tomorrow to set up urgent follow-up in the next few days

## 2019-06-15 ENCOUNTER — Other Ambulatory Visit: Payer: Self-pay | Admitting: Family Medicine

## 2019-06-15 DIAGNOSIS — Z1231 Encounter for screening mammogram for malignant neoplasm of breast: Secondary | ICD-10-CM

## 2019-08-05 ENCOUNTER — Ambulatory Visit
Admission: RE | Admit: 2019-08-05 | Discharge: 2019-08-05 | Disposition: A | Discharge status: Home / Self Care | Payer: Managed Care, Other (non HMO) | Source: Ambulatory Visit | Attending: Family Medicine | Admitting: Family Medicine

## 2019-08-05 ENCOUNTER — Other Ambulatory Visit: Payer: Self-pay

## 2019-08-05 DIAGNOSIS — Z1231 Encounter for screening mammogram for malignant neoplasm of breast: Secondary | ICD-10-CM

## 2019-09-17 ENCOUNTER — Ambulatory Visit: Payer: Medicare Other | Attending: Internal Medicine

## 2019-09-17 DIAGNOSIS — Z23 Encounter for immunization: Secondary | ICD-10-CM

## 2019-09-17 NOTE — Progress Notes (Signed)
   Covid-19 Vaccination Clinic  Name:  Shari Ortiz    MRN: [id number] DOB: [birth year]  09/17/2019  [name] was observed post Covid-19 immunization for 15 minutes without incidence. She was provided with Vaccine Information Sheet and instruction to access the V-Safe system.   [name] was instructed to call 911 with any severe reactions post vaccine: Marland Kitchen Difficulty breathing  . Swelling of your face and throat  . A fast heartbeat  . A bad rash all over your body  . Dizziness and weakness    Immunizations Administered    Name Date Dose VIS Date Route   Pfizer COVID-19 Vaccine 09/17/2019  1:10 PM 0.3 mL 08/07/2019 Intramuscular   Manufacturer: [place]   Lot: [id number]   [place]: [id number]

## 2019-09-20 ENCOUNTER — Ambulatory Visit: Payer: Managed Care, Other (non HMO)

## 2019-10-08 ENCOUNTER — Ambulatory Visit: Payer: Medicare Other | Attending: Internal Medicine

## 2019-10-08 DIAGNOSIS — Z23 Encounter for immunization: Secondary | ICD-10-CM | POA: Insufficient documentation

## 2019-10-08 NOTE — Progress Notes (Signed)
   Covid-19 Vaccination Clinic  Name:  POSIE HANSE    MRN: [id number] DOB: 15-Feb-1951  10/08/2019  Ms. Makinson was observed post Covid-19 immunization for 15 minutes without incidence. She was provided with Vaccine Information Sheet and instruction to access the V-Safe system.   Ms. Hurrle was instructed to call 911 with any severe reactions post vaccine: Marland Kitchen Difficulty breathing  . Swelling of your face and throat  . A fast heartbeat  . A bad rash all over your body  . Dizziness and weakness    Immunizations Administered    Name Date Dose VIS Date Route   Pfizer COVID-19 Vaccine 10/08/2019  1:23 PM 0.3 mL 08/07/2019 Intramuscular   Manufacturer: [place]   Lot: [id number]   [place]: [id number]

## 2020-01-12 DIAGNOSIS — I10 Essential (primary) hypertension: Secondary | ICD-10-CM | POA: Diagnosis not present

## 2020-02-02 DIAGNOSIS — H40013 Open angle with borderline findings, low risk, bilateral: Secondary | ICD-10-CM | POA: Diagnosis not present

## 2020-02-02 DIAGNOSIS — H11133 Conjunctival pigmentations, bilateral: Secondary | ICD-10-CM | POA: Diagnosis not present

## 2020-02-02 DIAGNOSIS — H31091 Other chorioretinal scars, right eye: Secondary | ICD-10-CM | POA: Diagnosis not present

## 2020-02-02 DIAGNOSIS — H5203 Hypermetropia, bilateral: Secondary | ICD-10-CM | POA: Diagnosis not present

## 2020-02-02 DIAGNOSIS — H40053 Ocular hypertension, bilateral: Secondary | ICD-10-CM | POA: Diagnosis not present

## 2020-02-02 DIAGNOSIS — H2513 Age-related nuclear cataract, bilateral: Secondary | ICD-10-CM | POA: Diagnosis not present

## 2020-02-02 DIAGNOSIS — E119 Type 2 diabetes mellitus without complications: Secondary | ICD-10-CM | POA: Diagnosis not present

## 2020-02-02 DIAGNOSIS — Z7984 Long term (current) use of oral hypoglycemic drugs: Secondary | ICD-10-CM | POA: Diagnosis not present

## 2020-02-02 DIAGNOSIS — H524 Presbyopia: Secondary | ICD-10-CM | POA: Diagnosis not present

## 2020-03-11 DIAGNOSIS — E785 Hyperlipidemia, unspecified: Secondary | ICD-10-CM | POA: Diagnosis not present

## 2020-03-11 DIAGNOSIS — E1169 Type 2 diabetes mellitus with other specified complication: Secondary | ICD-10-CM | POA: Diagnosis not present

## 2020-03-11 DIAGNOSIS — I10 Essential (primary) hypertension: Secondary | ICD-10-CM | POA: Diagnosis not present

## 2020-05-16 DIAGNOSIS — I10 Essential (primary) hypertension: Secondary | ICD-10-CM | POA: Diagnosis not present

## 2020-05-16 DIAGNOSIS — E1169 Type 2 diabetes mellitus with other specified complication: Secondary | ICD-10-CM | POA: Diagnosis not present

## 2020-05-20 DIAGNOSIS — E1169 Type 2 diabetes mellitus with other specified complication: Secondary | ICD-10-CM | POA: Diagnosis not present

## 2020-05-20 DIAGNOSIS — I10 Essential (primary) hypertension: Secondary | ICD-10-CM | POA: Diagnosis not present

## 2020-05-26 ENCOUNTER — Ambulatory Visit: Payer: Self-pay | Attending: Internal Medicine

## 2020-05-26 DIAGNOSIS — Z23 Encounter for immunization: Secondary | ICD-10-CM

## 2020-05-26 NOTE — Progress Notes (Signed)
   Covid-19 Vaccination Clinic  Name:  Shari Ortiz    MRN: [id number] DOB: December 15, 1950  05/26/2020  [name] was observed post Covid-19 immunization for 15 minutes without incident. She was provided with Vaccine Information Sheet and instruction to access the V-Safe system.   [name] was instructed to call 911 with any severe reactions post vaccine: Marland Kitchen Difficulty breathing  . Swelling of face and throat  . A fast heartbeat  . A bad rash all over body  . Dizziness and weakness

## 2020-07-01 DIAGNOSIS — E785 Hyperlipidemia, unspecified: Secondary | ICD-10-CM | POA: Diagnosis not present

## 2020-07-01 DIAGNOSIS — E876 Hypokalemia: Secondary | ICD-10-CM | POA: Diagnosis not present

## 2020-07-01 DIAGNOSIS — E1101 Type 2 diabetes mellitus with hyperosmolarity with coma: Secondary | ICD-10-CM | POA: Diagnosis not present

## 2020-07-01 DIAGNOSIS — E782 Mixed hyperlipidemia: Secondary | ICD-10-CM | POA: Diagnosis not present

## 2020-08-03 DIAGNOSIS — H524 Presbyopia: Secondary | ICD-10-CM | POA: Diagnosis not present

## 2020-08-04 DIAGNOSIS — H11133 Conjunctival pigmentations, bilateral: Secondary | ICD-10-CM | POA: Diagnosis not present

## 2020-08-04 DIAGNOSIS — H2513 Age-related nuclear cataract, bilateral: Secondary | ICD-10-CM | POA: Diagnosis not present

## 2020-08-04 DIAGNOSIS — H40013 Open angle with borderline findings, low risk, bilateral: Secondary | ICD-10-CM | POA: Diagnosis not present

## 2020-08-04 DIAGNOSIS — Z7984 Long term (current) use of oral hypoglycemic drugs: Secondary | ICD-10-CM | POA: Diagnosis not present

## 2020-08-04 DIAGNOSIS — H31091 Other chorioretinal scars, right eye: Secondary | ICD-10-CM | POA: Diagnosis not present

## 2020-08-04 DIAGNOSIS — E119 Type 2 diabetes mellitus without complications: Secondary | ICD-10-CM | POA: Diagnosis not present

## 2020-08-04 DIAGNOSIS — H5203 Hypermetropia, bilateral: Secondary | ICD-10-CM | POA: Diagnosis not present

## 2020-08-04 DIAGNOSIS — H40053 Ocular hypertension, bilateral: Secondary | ICD-10-CM | POA: Diagnosis not present

## 2020-08-04 DIAGNOSIS — H524 Presbyopia: Secondary | ICD-10-CM | POA: Diagnosis not present

## 2020-09-01 ENCOUNTER — Other Ambulatory Visit: Payer: Medicare HMO

## 2020-09-01 DIAGNOSIS — Z20822 Contact with and (suspected) exposure to covid-19: Secondary | ICD-10-CM | POA: Diagnosis not present

## 2020-09-03 LAB — SARS-COV-2, NAA 2 DAY TAT

## 2020-09-03 LAB — NOVEL CORONAVIRUS, NAA: SARS-CoV-2, NAA: NOT DETECTED

## 2020-09-03 LAB — SPECIMEN STATUS REPORT

## 2020-09-26 DIAGNOSIS — I10 Essential (primary) hypertension: Secondary | ICD-10-CM | POA: Diagnosis not present

## 2020-09-26 DIAGNOSIS — E785 Hyperlipidemia, unspecified: Secondary | ICD-10-CM | POA: Diagnosis not present

## 2020-10-21 DIAGNOSIS — E785 Hyperlipidemia, unspecified: Secondary | ICD-10-CM | POA: Diagnosis not present

## 2020-10-21 DIAGNOSIS — E876 Hypokalemia: Secondary | ICD-10-CM | POA: Diagnosis not present

## 2020-10-21 DIAGNOSIS — E1101 Type 2 diabetes mellitus with hyperosmolarity with coma: Secondary | ICD-10-CM | POA: Diagnosis not present

## 2020-10-21 DIAGNOSIS — I10 Essential (primary) hypertension: Secondary | ICD-10-CM | POA: Diagnosis not present

## 2020-10-24 DIAGNOSIS — E782 Mixed hyperlipidemia: Secondary | ICD-10-CM | POA: Diagnosis not present

## 2020-10-24 DIAGNOSIS — E1169 Type 2 diabetes mellitus with other specified complication: Secondary | ICD-10-CM | POA: Diagnosis not present

## 2020-10-24 DIAGNOSIS — I10 Essential (primary) hypertension: Secondary | ICD-10-CM | POA: Diagnosis not present

## 2020-10-28 DIAGNOSIS — I10 Essential (primary) hypertension: Secondary | ICD-10-CM | POA: Diagnosis not present

## 2020-10-28 DIAGNOSIS — E1169 Type 2 diabetes mellitus with other specified complication: Secondary | ICD-10-CM | POA: Diagnosis not present

## 2020-11-24 DIAGNOSIS — E1169 Type 2 diabetes mellitus with other specified complication: Secondary | ICD-10-CM | POA: Diagnosis not present

## 2020-11-24 DIAGNOSIS — E782 Mixed hyperlipidemia: Secondary | ICD-10-CM | POA: Diagnosis not present

## 2020-11-24 DIAGNOSIS — I10 Essential (primary) hypertension: Secondary | ICD-10-CM | POA: Diagnosis not present

## 2020-12-08 DIAGNOSIS — E782 Mixed hyperlipidemia: Secondary | ICD-10-CM | POA: Diagnosis not present

## 2020-12-08 DIAGNOSIS — E1169 Type 2 diabetes mellitus with other specified complication: Secondary | ICD-10-CM | POA: Diagnosis not present

## 2020-12-08 DIAGNOSIS — Z0000 Encounter for general adult medical examination without abnormal findings: Secondary | ICD-10-CM | POA: Diagnosis not present

## 2020-12-08 DIAGNOSIS — F4329 Adjustment disorder with other symptoms: Secondary | ICD-10-CM | POA: Diagnosis not present

## 2020-12-08 DIAGNOSIS — I10 Essential (primary) hypertension: Secondary | ICD-10-CM | POA: Diagnosis not present

## 2020-12-08 DIAGNOSIS — F4320 Adjustment disorder, unspecified: Secondary | ICD-10-CM | POA: Diagnosis not present

## 2020-12-21 DIAGNOSIS — Z23 Encounter for immunization: Secondary | ICD-10-CM | POA: Diagnosis not present

## 2021-02-23 DIAGNOSIS — E1169 Type 2 diabetes mellitus with other specified complication: Secondary | ICD-10-CM | POA: Diagnosis not present

## 2021-02-23 DIAGNOSIS — E782 Mixed hyperlipidemia: Secondary | ICD-10-CM | POA: Diagnosis not present

## 2021-02-23 DIAGNOSIS — I10 Essential (primary) hypertension: Secondary | ICD-10-CM | POA: Diagnosis not present

## 2021-03-26 DIAGNOSIS — I10 Essential (primary) hypertension: Secondary | ICD-10-CM | POA: Diagnosis not present

## 2021-03-26 DIAGNOSIS — E1169 Type 2 diabetes mellitus with other specified complication: Secondary | ICD-10-CM | POA: Diagnosis not present

## 2021-03-26 DIAGNOSIS — E782 Mixed hyperlipidemia: Secondary | ICD-10-CM | POA: Diagnosis not present

## 2021-04-10 DIAGNOSIS — E785 Hyperlipidemia, unspecified: Secondary | ICD-10-CM | POA: Diagnosis not present

## 2021-04-10 DIAGNOSIS — F4320 Adjustment disorder, unspecified: Secondary | ICD-10-CM | POA: Diagnosis not present

## 2021-04-10 DIAGNOSIS — E782 Mixed hyperlipidemia: Secondary | ICD-10-CM | POA: Diagnosis not present

## 2021-04-10 DIAGNOSIS — E1169 Type 2 diabetes mellitus with other specified complication: Secondary | ICD-10-CM | POA: Diagnosis not present

## 2021-04-10 DIAGNOSIS — I10 Essential (primary) hypertension: Secondary | ICD-10-CM | POA: Diagnosis not present

## 2021-05-26 DIAGNOSIS — E1169 Type 2 diabetes mellitus with other specified complication: Secondary | ICD-10-CM | POA: Diagnosis not present

## 2021-05-26 DIAGNOSIS — E782 Mixed hyperlipidemia: Secondary | ICD-10-CM | POA: Diagnosis not present

## 2021-05-26 DIAGNOSIS — I10 Essential (primary) hypertension: Secondary | ICD-10-CM | POA: Diagnosis not present

## 2021-06-05 DIAGNOSIS — Z23 Encounter for immunization: Secondary | ICD-10-CM | POA: Diagnosis not present

## 2021-07-26 DIAGNOSIS — I10 Essential (primary) hypertension: Secondary | ICD-10-CM | POA: Diagnosis not present

## 2021-07-26 DIAGNOSIS — E782 Mixed hyperlipidemia: Secondary | ICD-10-CM | POA: Diagnosis not present

## 2021-07-26 DIAGNOSIS — E1169 Type 2 diabetes mellitus with other specified complication: Secondary | ICD-10-CM | POA: Diagnosis not present

## 2021-08-07 DIAGNOSIS — H40013 Open angle with borderline findings, low risk, bilateral: Secondary | ICD-10-CM | POA: Diagnosis not present

## 2021-08-10 DIAGNOSIS — E7800 Pure hypercholesterolemia, unspecified: Secondary | ICD-10-CM | POA: Diagnosis not present

## 2021-08-10 DIAGNOSIS — E1169 Type 2 diabetes mellitus with other specified complication: Secondary | ICD-10-CM | POA: Diagnosis not present

## 2021-08-10 DIAGNOSIS — I10 Essential (primary) hypertension: Secondary | ICD-10-CM | POA: Diagnosis not present

## 2021-08-10 DIAGNOSIS — Z23 Encounter for immunization: Secondary | ICD-10-CM | POA: Diagnosis not present

## 2021-11-24 DIAGNOSIS — E782 Mixed hyperlipidemia: Secondary | ICD-10-CM | POA: Diagnosis not present

## 2021-11-24 DIAGNOSIS — I10 Essential (primary) hypertension: Secondary | ICD-10-CM | POA: Diagnosis not present

## 2021-11-24 DIAGNOSIS — E1169 Type 2 diabetes mellitus with other specified complication: Secondary | ICD-10-CM | POA: Diagnosis not present

## 2021-12-04 DIAGNOSIS — E1169 Type 2 diabetes mellitus with other specified complication: Secondary | ICD-10-CM | POA: Diagnosis not present

## 2021-12-04 DIAGNOSIS — I10 Essential (primary) hypertension: Secondary | ICD-10-CM | POA: Diagnosis not present

## 2021-12-08 DIAGNOSIS — Z6826 Body mass index (BMI) 26.0-26.9, adult: Secondary | ICD-10-CM | POA: Diagnosis not present

## 2021-12-08 DIAGNOSIS — I10 Essential (primary) hypertension: Secondary | ICD-10-CM | POA: Diagnosis not present

## 2021-12-08 DIAGNOSIS — E1169 Type 2 diabetes mellitus with other specified complication: Secondary | ICD-10-CM | POA: Diagnosis not present

## 2021-12-08 DIAGNOSIS — M130 Polyarthritis, unspecified: Secondary | ICD-10-CM | POA: Diagnosis not present

## 2022-01-24 DIAGNOSIS — E782 Mixed hyperlipidemia: Secondary | ICD-10-CM | POA: Diagnosis not present

## 2022-01-24 DIAGNOSIS — I10 Essential (primary) hypertension: Secondary | ICD-10-CM | POA: Diagnosis not present

## 2022-04-10 DIAGNOSIS — E782 Mixed hyperlipidemia: Secondary | ICD-10-CM | POA: Diagnosis not present

## 2022-04-10 DIAGNOSIS — E781 Pure hyperglyceridemia: Secondary | ICD-10-CM | POA: Diagnosis not present

## 2022-04-10 DIAGNOSIS — I10 Essential (primary) hypertension: Secondary | ICD-10-CM | POA: Diagnosis not present

## 2022-04-10 DIAGNOSIS — Z6826 Body mass index (BMI) 26.0-26.9, adult: Secondary | ICD-10-CM | POA: Diagnosis not present

## 2022-04-10 DIAGNOSIS — E1169 Type 2 diabetes mellitus with other specified complication: Secondary | ICD-10-CM | POA: Diagnosis not present

## 2022-06-26 DIAGNOSIS — I10 Essential (primary) hypertension: Secondary | ICD-10-CM | POA: Diagnosis not present

## 2022-06-26 DIAGNOSIS — E1169 Type 2 diabetes mellitus with other specified complication: Secondary | ICD-10-CM | POA: Diagnosis not present

## 2022-06-26 DIAGNOSIS — E782 Mixed hyperlipidemia: Secondary | ICD-10-CM | POA: Diagnosis not present

## 2022-07-26 DIAGNOSIS — E1169 Type 2 diabetes mellitus with other specified complication: Secondary | ICD-10-CM | POA: Diagnosis not present

## 2022-07-26 DIAGNOSIS — I10 Essential (primary) hypertension: Secondary | ICD-10-CM | POA: Diagnosis not present

## 2022-07-26 DIAGNOSIS — E782 Mixed hyperlipidemia: Secondary | ICD-10-CM | POA: Diagnosis not present

## 2022-08-07 DIAGNOSIS — H40013 Open angle with borderline findings, low risk, bilateral: Secondary | ICD-10-CM | POA: Diagnosis not present

## 2022-08-10 DIAGNOSIS — E1169 Type 2 diabetes mellitus with other specified complication: Secondary | ICD-10-CM | POA: Diagnosis not present

## 2022-08-10 DIAGNOSIS — I10 Essential (primary) hypertension: Secondary | ICD-10-CM | POA: Diagnosis not present

## 2022-08-10 DIAGNOSIS — Z0000 Encounter for general adult medical examination without abnormal findings: Secondary | ICD-10-CM | POA: Diagnosis not present

## 2022-08-16 ENCOUNTER — Other Ambulatory Visit: Payer: Self-pay | Admitting: Family Medicine

## 2022-08-16 DIAGNOSIS — Z1231 Encounter for screening mammogram for malignant neoplasm of breast: Secondary | ICD-10-CM

## 2022-08-16 DIAGNOSIS — Z13820 Encounter for screening for osteoporosis: Secondary | ICD-10-CM

## 2022-09-25 DIAGNOSIS — I10 Essential (primary) hypertension: Secondary | ICD-10-CM | POA: Diagnosis not present

## 2022-09-25 DIAGNOSIS — E782 Mixed hyperlipidemia: Secondary | ICD-10-CM | POA: Diagnosis not present

## 2022-09-25 DIAGNOSIS — E1169 Type 2 diabetes mellitus with other specified complication: Secondary | ICD-10-CM | POA: Diagnosis not present

## 2023-02-07 ENCOUNTER — Ambulatory Visit
Admission: RE | Admit: 2023-02-07 | Discharge: 2023-02-07 | Disposition: A | Discharge status: Home / Self Care | Payer: Self-pay | Source: Ambulatory Visit | Attending: Family Medicine | Admitting: Family Medicine

## 2023-02-07 ENCOUNTER — Ambulatory Visit
Admission: RE | Admit: 2023-02-07 | Discharge: 2023-02-07 | Disposition: A | Discharge status: Home / Self Care | Payer: Medicare HMO | Source: Ambulatory Visit | Attending: Family Medicine | Admitting: Family Medicine

## 2023-02-07 DIAGNOSIS — Z90721 Acquired absence of ovaries, unilateral: Secondary | ICD-10-CM | POA: Diagnosis not present

## 2023-02-07 DIAGNOSIS — Z13820 Encounter for screening for osteoporosis: Secondary | ICD-10-CM

## 2023-02-07 DIAGNOSIS — Z1231 Encounter for screening mammogram for malignant neoplasm of breast: Secondary | ICD-10-CM

## 2023-02-07 DIAGNOSIS — N951 Menopausal and female climacteric states: Secondary | ICD-10-CM | POA: Diagnosis not present

## 2023-02-07 DIAGNOSIS — E349 Endocrine disorder, unspecified: Secondary | ICD-10-CM | POA: Diagnosis not present

## 2023-02-07 DIAGNOSIS — Z90710 Acquired absence of both cervix and uterus: Secondary | ICD-10-CM | POA: Diagnosis not present

## 2023-02-19 DIAGNOSIS — E559 Vitamin D deficiency, unspecified: Secondary | ICD-10-CM | POA: Diagnosis not present

## 2023-02-19 DIAGNOSIS — E785 Hyperlipidemia, unspecified: Secondary | ICD-10-CM | POA: Diagnosis not present

## 2023-02-19 DIAGNOSIS — I10 Essential (primary) hypertension: Secondary | ICD-10-CM | POA: Diagnosis not present

## 2023-02-19 DIAGNOSIS — M130 Polyarthritis, unspecified: Secondary | ICD-10-CM | POA: Diagnosis not present

## 2023-02-19 DIAGNOSIS — E1169 Type 2 diabetes mellitus with other specified complication: Secondary | ICD-10-CM | POA: Diagnosis not present

## 2023-03-05 DIAGNOSIS — E1169 Type 2 diabetes mellitus with other specified complication: Secondary | ICD-10-CM | POA: Diagnosis not present

## 2023-03-08 ENCOUNTER — Encounter: Payer: Self-pay | Admitting: *Deleted

## 2023-04-02 DIAGNOSIS — E1169 Type 2 diabetes mellitus with other specified complication: Secondary | ICD-10-CM | POA: Diagnosis not present

## 2023-04-02 DIAGNOSIS — I10 Essential (primary) hypertension: Secondary | ICD-10-CM | POA: Diagnosis not present

## 2023-04-16 DIAGNOSIS — E1169 Type 2 diabetes mellitus with other specified complication: Secondary | ICD-10-CM | POA: Diagnosis not present

## 2023-04-16 DIAGNOSIS — I10 Essential (primary) hypertension: Secondary | ICD-10-CM | POA: Diagnosis not present

## 2023-04-30 DIAGNOSIS — E1169 Type 2 diabetes mellitus with other specified complication: Secondary | ICD-10-CM | POA: Diagnosis not present

## 2023-05-14 DIAGNOSIS — E1169 Type 2 diabetes mellitus with other specified complication: Secondary | ICD-10-CM | POA: Diagnosis not present

## 2023-05-28 DIAGNOSIS — E1169 Type 2 diabetes mellitus with other specified complication: Secondary | ICD-10-CM | POA: Diagnosis not present

## 2023-05-28 DIAGNOSIS — I10 Essential (primary) hypertension: Secondary | ICD-10-CM | POA: Diagnosis not present

## 2023-05-28 DIAGNOSIS — H68101 Unspecified obstruction of Eustachian tube, right ear: Secondary | ICD-10-CM | POA: Diagnosis not present

## 2023-06-03 DIAGNOSIS — E1169 Type 2 diabetes mellitus with other specified complication: Secondary | ICD-10-CM | POA: Diagnosis not present

## 2023-06-11 DIAGNOSIS — E1169 Type 2 diabetes mellitus with other specified complication: Secondary | ICD-10-CM | POA: Diagnosis not present

## 2023-06-25 DIAGNOSIS — E1169 Type 2 diabetes mellitus with other specified complication: Secondary | ICD-10-CM | POA: Diagnosis not present

## 2023-07-09 DIAGNOSIS — E1169 Type 2 diabetes mellitus with other specified complication: Secondary | ICD-10-CM | POA: Diagnosis not present

## 2023-08-12 DIAGNOSIS — H5789 Other specified disorders of eye and adnexa: Secondary | ICD-10-CM | POA: Diagnosis not present

## 2023-08-12 DIAGNOSIS — Z6824 Body mass index (BMI) 24.0-24.9, adult: Secondary | ICD-10-CM | POA: Diagnosis not present

## 2023-08-12 DIAGNOSIS — E1169 Type 2 diabetes mellitus with other specified complication: Secondary | ICD-10-CM | POA: Diagnosis not present

## 2023-08-12 DIAGNOSIS — Z0000 Encounter for general adult medical examination without abnormal findings: Secondary | ICD-10-CM | POA: Diagnosis not present

## 2023-08-12 DIAGNOSIS — G540 Brachial plexus disorders: Secondary | ICD-10-CM | POA: Diagnosis not present

## 2023-08-12 DIAGNOSIS — G59 Mononeuropathy in diseases classified elsewhere: Secondary | ICD-10-CM | POA: Diagnosis not present

## 2023-08-12 DIAGNOSIS — H1032 Unspecified acute conjunctivitis, left eye: Secondary | ICD-10-CM | POA: Diagnosis not present

## 2023-08-13 ENCOUNTER — Other Ambulatory Visit: Payer: Self-pay | Admitting: Family Medicine

## 2023-08-13 ENCOUNTER — Ambulatory Visit
Admission: RE | Admit: 2023-08-13 | Discharge: 2023-08-13 | Disposition: A | Discharge status: Home / Self Care | Payer: Medicare HMO | Source: Ambulatory Visit | Attending: Family Medicine | Admitting: Family Medicine

## 2023-08-13 DIAGNOSIS — R52 Pain, unspecified: Secondary | ICD-10-CM

## 2023-08-13 DIAGNOSIS — M19041 Primary osteoarthritis, right hand: Secondary | ICD-10-CM | POA: Diagnosis not present

## 2023-08-13 DIAGNOSIS — M25531 Pain in right wrist: Secondary | ICD-10-CM | POA: Diagnosis not present

## 2023-08-13 DIAGNOSIS — M1811 Unilateral primary osteoarthritis of first carpometacarpal joint, right hand: Secondary | ICD-10-CM | POA: Diagnosis not present

## 2023-08-16 DIAGNOSIS — H43813 Vitreous degeneration, bilateral: Secondary | ICD-10-CM | POA: Diagnosis not present

## 2023-08-16 DIAGNOSIS — H524 Presbyopia: Secondary | ICD-10-CM | POA: Diagnosis not present

## 2023-08-27 DIAGNOSIS — E782 Mixed hyperlipidemia: Secondary | ICD-10-CM | POA: Diagnosis not present

## 2023-08-27 DIAGNOSIS — I10 Essential (primary) hypertension: Secondary | ICD-10-CM | POA: Diagnosis not present

## 2023-08-27 DIAGNOSIS — E1169 Type 2 diabetes mellitus with other specified complication: Secondary | ICD-10-CM | POA: Diagnosis not present

## 2023-09-01 DIAGNOSIS — E1169 Type 2 diabetes mellitus with other specified complication: Secondary | ICD-10-CM | POA: Diagnosis not present

## 2023-09-12 DIAGNOSIS — I10 Essential (primary) hypertension: Secondary | ICD-10-CM | POA: Diagnosis not present

## 2023-09-12 DIAGNOSIS — E1169 Type 2 diabetes mellitus with other specified complication: Secondary | ICD-10-CM | POA: Diagnosis not present

## 2023-09-12 DIAGNOSIS — E7800 Pure hypercholesterolemia, unspecified: Secondary | ICD-10-CM | POA: Diagnosis not present

## 2023-11-21 DIAGNOSIS — I10 Essential (primary) hypertension: Secondary | ICD-10-CM | POA: Diagnosis not present

## 2023-11-21 DIAGNOSIS — E1169 Type 2 diabetes mellitus with other specified complication: Secondary | ICD-10-CM | POA: Diagnosis not present

## 2023-11-30 DIAGNOSIS — E1169 Type 2 diabetes mellitus with other specified complication: Secondary | ICD-10-CM | POA: Diagnosis not present

## 2023-12-19 DIAGNOSIS — I10 Essential (primary) hypertension: Secondary | ICD-10-CM | POA: Diagnosis not present

## 2023-12-19 DIAGNOSIS — E782 Mixed hyperlipidemia: Secondary | ICD-10-CM | POA: Diagnosis not present

## 2023-12-19 DIAGNOSIS — E1169 Type 2 diabetes mellitus with other specified complication: Secondary | ICD-10-CM | POA: Diagnosis not present

## 2024-02-11 ENCOUNTER — Other Ambulatory Visit: Payer: Self-pay | Admitting: Family Medicine

## 2024-02-11 DIAGNOSIS — Z1231 Encounter for screening mammogram for malignant neoplasm of breast: Secondary | ICD-10-CM

## 2024-02-19 ENCOUNTER — Ambulatory Visit
Admission: RE | Admit: 2024-02-19 | Discharge: 2024-02-19 | Disposition: A | Discharge status: Home / Self Care | Source: Ambulatory Visit | Attending: Family Medicine | Admitting: Family Medicine

## 2024-02-19 DIAGNOSIS — Z1231 Encounter for screening mammogram for malignant neoplasm of breast: Secondary | ICD-10-CM | POA: Diagnosis not present

## 2024-02-25 ENCOUNTER — Ambulatory Visit

## 2024-02-28 DIAGNOSIS — E1169 Type 2 diabetes mellitus with other specified complication: Secondary | ICD-10-CM | POA: Diagnosis not present

## 2024-03-19 ENCOUNTER — Other Ambulatory Visit (HOSPITAL_COMMUNITY): Payer: Self-pay

## 2024-03-19 DIAGNOSIS — E7800 Pure hypercholesterolemia, unspecified: Secondary | ICD-10-CM | POA: Diagnosis not present

## 2024-03-19 DIAGNOSIS — Z6823 Body mass index (BMI) 23.0-23.9, adult: Secondary | ICD-10-CM | POA: Diagnosis not present

## 2024-03-19 DIAGNOSIS — I10 Essential (primary) hypertension: Secondary | ICD-10-CM | POA: Diagnosis not present

## 2024-03-19 DIAGNOSIS — E118 Type 2 diabetes mellitus with unspecified complications: Secondary | ICD-10-CM | POA: Diagnosis not present

## 2024-03-25 ENCOUNTER — Telehealth: Payer: Self-pay

## 2024-03-25 NOTE — Telephone Encounter (Signed)
 PAP: Application for Bernadine has been submitted to [employer]), via fax  PAP: Application for Lantus has been submitted to [employer], via fax

## 2024-03-27 NOTE — Telephone Encounter (Signed)
 PAP: Patient assistance application for Lantus has been approved by PAP Companies: [employer] from 03/27/2024 to 08/26/2024. Medication should be delivered to PAP Delivery: Provider's office. For further shipping updates, please contact [employer] at [phone]. Patient ID is: [id number]

## 2024-03-30 NOTE — Telephone Encounter (Signed)
 Left HIPAA compliant voice mail for patient to return my call.  [employer] sent a fax saying they need proof of income to process patient assistance application

## 2024-04-09 ENCOUNTER — Telehealth: Payer: Self-pay

## 2024-04-09 NOTE — Progress Notes (Signed)
   04/09/2024  Patient ID: Shari Ortiz, female   DOB: 1951/06/20, 73 y.o.   MRN: [id number]  Contacted patient regarding referral for medication access from Benjamine Aland, MD .    2025 Medication Assistance Application Summary:  Patient was outreached regarding medication assistance renewal for 2025. Verified address, anticipated insurance for 2025, and income has not changed. Patient remains interested in PAP for 2025 for Lantus and Jardiance, no other new medications were identified for medication assistance.   Medication Assistance Findings:  Medication assistance needs identified: Lantus and Jardiance     Additional medication assistance options reviewed with patient as warranted:  No other options identified  Plan: I will route patient assistance letter to pharmacy technician who will coordinate patient assistance program application process for medications listed above.  Pharmacy technician will assist with obtaining all required documents from both patient and provider(s) and submit application(s) once completed.    Thank you for allowing pharmacy to be a part of this patient's care.  Heather Factor, PharmD Clinical Pharmacist  [phone]

## 2024-04-23 NOTE — Telephone Encounter (Addendum)
 Left HIPAA compliant  voice mail requesting proof of income for [facility] [employer] patient assistance application-2nd attpemt

## 2024-05-28 DIAGNOSIS — E1169 Type 2 diabetes mellitus with other specified complication: Secondary | ICD-10-CM | POA: Diagnosis not present

## 2024-06-19 DIAGNOSIS — E118 Type 2 diabetes mellitus with unspecified complications: Secondary | ICD-10-CM | POA: Diagnosis not present

## 2024-06-19 DIAGNOSIS — E559 Vitamin D deficiency, unspecified: Secondary | ICD-10-CM | POA: Diagnosis not present

## 2024-06-19 DIAGNOSIS — E119 Type 2 diabetes mellitus without complications: Secondary | ICD-10-CM | POA: Diagnosis not present

## 2024-06-22 ENCOUNTER — Telehealth: Payer: Self-pay

## 2024-06-22 NOTE — Progress Notes (Signed)
   06/22/2024  Patient ID: Shari Ortiz, female   DOB: 12-23-1950, 73 y.o.   MRN: [id number]   2026 Medication Assistance Renewal Application Summary:  Patient was outreached regarding medication assistance renewal for 2026. Verified address, anticipated insurance for 2026, and income has not changed. Patient remains interested in PAP for 2026 for Jardiance and Lantus, no other new medications were identified for medication assistance.    Medication Assistance Findings:  Medication assistance needs identified: Jardiance and Lantus     Additional medication assistance options reviewed with patient as warranted:  No other options identified  Plan: I will route patient assistance letter to pharmacy technician who will coordinate patient assistance program application process for medications listed above.  Pharmacy technician will assist with obtaining all required documents from both patient and provider(s) and submit application(s) once completed.    Thank you for allowing pharmacy to be a part of this patient's care.  Heather Factor, PharmD Clinical Pharmacist  [phone]

## 2024-06-23 ENCOUNTER — Telehealth: Payer: Self-pay

## 2024-06-23 ENCOUNTER — Other Ambulatory Visit (HOSPITAL_COMMUNITY): Payer: Self-pay

## 2024-06-23 NOTE — Telephone Encounter (Signed)
 2026 Renewal  PAP: Patient assistance application for Lantus through [employer] has been mailed to pt's home address on file. Provider portion of application will be faxed to provider's office.  PAP: Patient assistance application for Jardiance through [employer]) has been mailed to pt's home address on file. Provider portion of application will be faxed to provider's office.     Provider portion of application will be sent to Dr. Kennieth Leech at [facility]  Applications will be mailed 11/3

## 2024-07-09 ENCOUNTER — Telehealth: Payer: Self-pay

## 2024-07-09 NOTE — Progress Notes (Signed)
   07/09/2024  Patient ID: Clarita KATHEE Prude, female   DOB: 1950/11/24, 73 y.o.   MRN: [id number]  Called patient to follow up on medication assistance re-enrollment. She reports receiving a letter of denial for what sounds like her Jardiance PAP. She was rushing out for an appointment and requests a follow-up call.   I will follow-up tomorrow.   Heather Factor, PharmD Clinical Pharmacist  [phone]

## 2024-08-05 NOTE — Telephone Encounter (Signed)
 Left HIPAA compliant voicemail regarding applications mailed on 11/3.  Asked patient to return my call and verify if she received them

## 2024-08-13 NOTE — Telephone Encounter (Signed)
 Received provider portion of application

## 2024-08-25 ENCOUNTER — Telehealth: Payer: Self-pay

## 2024-08-25 NOTE — Progress Notes (Signed)
" ° °  08/25/2024  Patient ID: Shari Ortiz, female   DOB: 09-Apr-1951, 73 y.o.   MRN: [id number]  Called patient to follow up on medication assistance re-enrollment. I was unable to reach the patient today. I left a HIPAA compliant voicemail for the patient to return my call.   Heather Factor, PharmD Clinical Pharmacist  [phone]   "

## 2024-09-10 ENCOUNTER — Telehealth: Payer: Self-pay

## 2024-09-10 NOTE — Telephone Encounter (Signed)
 Left Voice mail regarding patient asssitacne applications mailed 11/3

## 2024-09-10 NOTE — Progress Notes (Signed)
" ° °  09/10/2024  Patient ID: Shari Ortiz, female   DOB: November 30, 1950, 74 y.o.   MRN: [id number]  Called patient to follow up on medication assistance re-enrollment. I was unable to reach the patient today. I left a HIPAA compliant voicemail for the patient to return my call.    Heather Factor, PharmD Clinical Pharmacist  [phone]  "

## 2024-09-14 NOTE — Telephone Encounter (Signed)
 Unable to reach patient after multiple attempts, patient did not return application.  Will process applications if patient retunrs apps or contacts me.

## 2024-09-29 ENCOUNTER — Encounter: Payer: Self-pay | Admitting: *Deleted

## 2024-09-29 NOTE — Progress Notes (Signed)
 SINTHIA KARABIN                                          MRN: [id number]   09/29/2024   The [facility] Quality Team Specialist reviewed this patient medical record for the purposes of chart review for care gap closure. The following were reviewed: chart review for care gap closure-controlling blood pressure.    [facility] Quality Team
